# Patient Record
Sex: Female | Born: 1980 | Race: White | Hispanic: No | Marital: Married | Smoking: Never smoker
Health system: Southern US, Community
[De-identification: ages and names within clinical notes are randomized; demographics above are authoritative.]

## PROBLEM LIST (undated history)

## (undated) DIAGNOSIS — T7840XA Allergy, unspecified, initial encounter: Secondary | ICD-10-CM

## (undated) DIAGNOSIS — J302 Other seasonal allergic rhinitis: Secondary | ICD-10-CM

## (undated) DIAGNOSIS — E282 Polycystic ovarian syndrome: Secondary | ICD-10-CM

## (undated) DIAGNOSIS — E119 Type 2 diabetes mellitus without complications: Secondary | ICD-10-CM

## (undated) HISTORY — DX: Type 2 diabetes mellitus without complications: E11.9

## (undated) HISTORY — DX: Other seasonal allergic rhinitis: J30.2

## (undated) HISTORY — DX: Polycystic ovarian syndrome: E28.2

## (undated) HISTORY — DX: Allergy, unspecified, initial encounter: T78.40XA

---

## 2010-11-03 ENCOUNTER — Emergency Department (HOSPITAL_COMMUNITY)
Admission: EM | Admit: 2010-11-03 | Discharge: 2010-11-04 | Payer: Self-pay | Source: Home / Self Care | Admitting: Emergency Medicine

## 2010-11-06 LAB — COMPREHENSIVE METABOLIC PANEL
ALT: 13 U/L (ref 0–35)
AST: 22 U/L (ref 0–37)
Albumin: 4.3 g/dL (ref 3.5–5.2)
Alkaline Phosphatase: 68 U/L (ref 39–117)
BUN: 10 mg/dL (ref 6–23)
CO2: 23 mEq/L (ref 19–32)
Calcium: 8.9 mg/dL (ref 8.4–10.5)
Chloride: 100 mEq/L (ref 96–112)
Creatinine, Ser: 0.68 mg/dL (ref 0.4–1.2)
GFR calc Af Amer: >60 mL/min (ref >60)
GFR calc non Af Amer: >60 mL/min (ref >60)
Glucose, Bld: 150 mg/dL — ABNORMAL HIGH (ref 70–99)
Potassium: 2.8 mEq/L — ABNORMAL LOW (ref 3.5–5.1)
Sodium: 133 mEq/L — ABNORMAL LOW (ref 135–145)
Total Bilirubin: 0.4 mg/dL (ref 0.3–1.2)
Total Protein: 7.2 g/dL (ref 6.0–8.3)

## 2010-11-06 LAB — URINALYSIS, ROUTINE W REFLEX MICROSCOPIC
Bilirubin Urine: NEGATIVE
Ketones, ur: 40 mg/dL — AB
Leukocytes, UA: NEGATIVE
Nitrite: NEGATIVE
Protein, ur: NEGATIVE mg/dL
Specific Gravity, Urine: 1.045 — ABNORMAL HIGH (ref 1.005–1.030)
Urine Glucose, Fasting: >1000 mg/dL — AB
Urobilinogen, UA: 0.2 mg/dL (ref 0.0–1.0)
pH: 6 (ref 5.0–8.0)

## 2010-11-06 LAB — DIFFERENTIAL
Basophils Absolute: 0 10*3/uL (ref 0.0–0.1)
Basophils Relative: 0 % (ref 0–1)
Eosinophils Absolute: 0.1 10*3/uL (ref 0.0–0.7)
Eosinophils Relative: 0 % (ref 0–5)
Lymphocytes Relative: 8 % — ABNORMAL LOW (ref 12–46)
Lymphs Abs: 1.2 10*3/uL (ref 0.7–4.0)
Monocytes Absolute: 0.5 10*3/uL (ref 0.1–1.0)
Monocytes Relative: 3 % (ref 3–12)
Neutro Abs: 13.6 10*3/uL — ABNORMAL HIGH (ref 1.7–7.7)
Neutrophils Relative %: 89 % — ABNORMAL HIGH (ref 43–77)

## 2010-11-06 LAB — CBC
HCT: 40.3 % (ref 36.0–46.0)
Hemoglobin: 13.5 g/dL (ref 12.0–15.0)
MCH: 28.9 pg (ref 26.0–34.0)
MCHC: 33.5 g/dL (ref 30.0–36.0)
MCV: 86.3 fL (ref 78.0–100.0)
Platelets: 281 10*3/uL (ref 150–400)
RBC: 4.67 MIL/uL (ref 3.87–5.11)
RDW: 13 % (ref 11.5–15.5)
WBC: 15.3 10*3/uL — ABNORMAL HIGH (ref 4.0–10.5)

## 2010-11-06 LAB — POCT PREGNANCY, URINE: Preg Test, Ur: NEGATIVE

## 2010-11-06 LAB — URINE MICROSCOPIC-ADD ON

## 2010-11-06 LAB — GLUCOSE, CAPILLARY: Glucose-Capillary: 153 mg/dL — ABNORMAL HIGH (ref 70–99)

## 2010-11-06 LAB — WET PREP, GENITAL
Clue Cells Wet Prep HPF POC: NONE SEEN
Trich, Wet Prep: NONE SEEN
WBC, Wet Prep HPF POC: NONE SEEN
Yeast Wet Prep HPF POC: NONE SEEN

## 2010-11-06 LAB — LIPASE, BLOOD: Lipase: 25 U/L (ref 11–59)

## 2010-11-08 LAB — GC/CHLAMYDIA PROBE AMP, GENITAL
Chlamydia, DNA Probe: NEGATIVE
GC Probe Amp, Genital: NEGATIVE

## 2010-11-10 ENCOUNTER — Ambulatory Visit (HOSPITAL_COMMUNITY)
Admission: RE | Admit: 2010-11-10 | Discharge: 2010-11-10 | Payer: Self-pay | Source: Home / Self Care | Attending: Obstetrics & Gynecology | Admitting: Obstetrics & Gynecology

## 2010-11-10 ENCOUNTER — Encounter (INDEPENDENT_AMBULATORY_CARE_PROVIDER_SITE_OTHER): Payer: Self-pay | Admitting: Obstetrics & Gynecology

## 2010-11-13 LAB — GLUCOSE, CAPILLARY
Glucose-Capillary: 211 mg/dL — ABNORMAL HIGH (ref 70–99)
Glucose-Capillary: 183 mg/dL — ABNORMAL HIGH (ref 70–99)
Glucose-Capillary: 189 mg/dL — ABNORMAL HIGH (ref 70–99)

## 2010-11-13 LAB — DIFFERENTIAL
Basophils Absolute: 0 10*3/uL (ref 0.0–0.1)
Basophils Relative: 1 % (ref 0–1)
Eosinophils Absolute: 0.1 10*3/uL (ref 0.0–0.7)
Eosinophils Relative: 2 % (ref 0–5)
Lymphocytes Relative: 26 % (ref 12–46)
Lymphs Abs: 1.1 10*3/uL (ref 0.7–4.0)
Monocytes Absolute: 0.3 10*3/uL (ref 0.1–1.0)
Monocytes Relative: 8 % (ref 3–12)
Neutro Abs: 2.7 10*3/uL (ref 1.7–7.7)
Neutrophils Relative %: 64 % (ref 43–77)

## 2010-11-13 LAB — COMPREHENSIVE METABOLIC PANEL
ALT: 10 U/L (ref 0–35)
AST: 17 U/L (ref 0–37)
Albumin: 4 g/dL (ref 3.5–5.2)
Alkaline Phosphatase: 63 U/L (ref 39–117)
BUN: 9 mg/dL (ref 6–23)
CO2: 30 mEq/L (ref 19–32)
Calcium: 9.1 mg/dL (ref 8.4–10.5)
Chloride: 103 mEq/L (ref 96–112)
Creatinine, Ser: 0.62 mg/dL (ref 0.4–1.2)
GFR calc Af Amer: >60 mL/min (ref >60)
GFR calc non Af Amer: >60 mL/min (ref >60)
Glucose, Bld: 231 mg/dL — ABNORMAL HIGH (ref 70–99)
Potassium: 3.9 mEq/L (ref 3.5–5.1)
Sodium: 139 mEq/L (ref 135–145)
Total Bilirubin: 0.5 mg/dL (ref 0.3–1.2)
Total Protein: 7.1 g/dL (ref 6.0–8.3)

## 2010-11-13 LAB — SURGICAL PCR SCREEN
MRSA, PCR: NEGATIVE
Staphylococcus aureus: POSITIVE — AB

## 2010-11-13 LAB — CBC
HCT: 42.6 % (ref 36.0–46.0)
Hemoglobin: 14.3 g/dL (ref 12.0–15.0)
MCH: 29.3 pg (ref 26.0–34.0)
MCHC: 33.6 g/dL (ref 30.0–36.0)
MCV: 87.3 fL (ref 78.0–100.0)
Platelets: 284 10*3/uL (ref 150–400)
RBC: 4.88 MIL/uL (ref 3.87–5.11)
RDW: 13.2 % (ref 11.5–15.5)
WBC: 4.3 10*3/uL (ref 4.0–10.5)

## 2010-11-13 LAB — PREGNANCY, URINE: Preg Test, Ur: NEGATIVE

## 2010-11-14 LAB — GLUCOSE, CAPILLARY
Glucose-Capillary: 149 mg/dL — ABNORMAL HIGH (ref 70–99)
Glucose-Capillary: 153 mg/dL — ABNORMAL HIGH (ref 70–99)
Glucose-Capillary: 183 mg/dL — ABNORMAL HIGH (ref 70–99)

## 2010-11-20 NOTE — Op Note (Signed)
Christine Compton, ROMANEK                ACCOUNT NO.:  [id number]  MEDICAL RECORD NO.:  [id number]          PATIENT TYPE:  OIB  LOCATION:  9307                          FACILITY:  [facility]  PHYSICIAN:  Darryl Nestle, MD     DATE OF BIRTH:  Nov 11, 1980  DATE OF PROCEDURE: DATE OF DISCHARGE:  11/10/2010                              OPERATIVE REPORT   PREOPERATIVE DIAGNOSIS:  Bilateral large ovarian cyst.  POSTOPERATIVE DIAGNOSIS:  Bilateral large ovarian cyst.  PROCEDURE:  Da Vinci robot assisted laparoscopic bilateral ovarian cystectomy.  SURGEON:  Darryl Nestle, MD.  ASSISTANTCordelia Pen A. Rosalio Macadamia, M.D.  ANESTHESIA:  General.  FINDINGS:  Right ovary enlarged about 10 cm with 2 large cysts, left ovary enlarged to about 6 to 7 cm with one cyst.  No surface nodules, no abnormal vascularity.  Tubes normal, appendix normal.  No evidence of endometriosis or adhesions and upper abdomen grossly normal.  SPECIMEN:  Ovarian cyst wall was from both the ovaries sent to Pathology.  ESTIMATED BLOOD LOSS:  Minimal.  IV FLUIDS:  LR 2000 mL.  URINE OUTPUT:  800 mL clear.  COMPLICATIONS:  None.  CONDITION:  Stable.  The patient brought to PACU.  PROCEDURE:  The patient is a 30 year old with type 1 diabetes, polycystic ovarian syndrome who presented with abdominal pain and was noted on CT scan to have bilateral ovarian cyst, the right ovary being about 12 cm in size with 2 large cysts and the left ovary being 7 cm in size with 1 large cyst.  The patient was given options of laparotomy versus laparoscopic removal and chose to proceed with laparoscopic route.  Risks and complications of surgery including infection, bleeding, damage to internal organs, recurrence of cyst as well as possible laparotomy and long-term complications were reviewed.  The patient was understanding.  Informed written consent was obtained.  The patient was brought to the operating room with IV running.  She underwent  general endotracheal anesthesia without difficulty.  She was then given dorsal lithotomy position. Parts were prepped, Foley catheter was placed, and then she was draped in normal sterile fashion. Examination under anesthesia revealed right-sided pelvic mass, soft, cystic; however, no left-sided mass was palpable.  The cervix was exposed with 2 Deaver retractors and anterior lip of cervix was grasped with tenaculum.  Uterus sounded to 7 cm.  A Hulka manipulator was introduced and was attached to the anterior cervical lip.  The 2-D retractors were removed.  A half size lap sponge was placed underneath the vaginal manipulator between the manipulator and the vaginal posterior fourchette.  Gloves were changed, attention was now focused to the abdomen.  A vertical midline incision was made 2 cm above the umbilicus due to the large size of the mass and incision was carried down to the underlying fascia.  Fascia was grasped in the midline and extended superiorly, incised, and extended superiorly and inferiorly. Posterior rectus sheath was thick which was grasped with 2 mosquito clamps and incised in between and with further dissection, peritoneal entry was made.  A pursestring stitch was taken around the fascia with 0 Vicryl, then  Hasson cannula was introduced and pneumo insufflation was begun, which was noted to be adequate.  Two incisions were made on both the sides in the lower part of the abdomen for 3 robotic ports and 1 assistant port after injecting 0.25% Marcaine and with direct visualization with the laparoscope, the trocar cannulas were introduced in standard fashion.  The patient was given Trendelenburg position and examination of the pelvis revealed a large right ovarian cyst, a relatively smaller left ovary with the cyst within and other findings as discussed above.  The robot was now brought to the patient's left side and was docked in a standard fashion from the side.  In the first  arm, an endoscissors with energy was introduced, in the second arm PK with bipolar energy was introduced, and the third a Prograsp was introduced. All the instruments were now secured and Dr. Juliene Pina scrubbed out at this point to go to the robotic console.  A thorough peritoneal irrigation was performed for potential malignant cells and the irrigation fluid was sent to Pathology.  Uterus was made anteverted and both the ovaries were inspected.  Tubes appeared normal, right ovary was enlarged, left ovary was slightly enlarged compared to the right, and bilateral ureters were seen very well.  Appendix appeared to be normal and upper abdomen survey was normal.  With Endoscissors, a linear incision was made on the right ovarian cyst away from the hilum and incision was extended.  At this point, the cyst wall ruptured as it was very thin and the patient expelled significant amount of fluid.  Irrigation was performed.  The cut edges of the cyst along with the ovarian capsule were grasped and the cyst wall could be separated easily from the ovarian capsule.  This was done in stepwise fashion.  Also at the same time, we obtained hemostasis.  The cyst wall was entirely dissected off and was removed and sent to Pathology.  There was another cyst noted underneath the one we removed, which was removed again through the same opening by creating a linear incision and again dissecting the cyst wall ovary from the ovarian capsule.  Hemostasis was obtained with cauterization. Irrigation was performed of the ovarian bed and hemostasis appeared to be adequate.  Now the attention was focused on the patient's left ovary, which has thicker capsule that the right and was difficult to identify the cyst but on further dissection into the capsule, a cyst in the ovary was easily seen, which was about 5 cm in size.  This too was dissected away from the ovarian capsule and cyst wall was removed and sent to Pathology.   Irrigation was performed thoroughly of the ovarian cyst bed and hemostasis was obtained.  At this point, bilateral ovarian cystectomy was complete.  Decision was made to de-dock the robot.  All instruments were removed.  At this point, the ovarian cystectomy was complete and two pieces of Interceed were placed in the abdominal cavity and each was used to wrap the raw ovarian surface after copious suction and irrigation.  At this point, surgery was deemed complete.  All instruments were removed.  Robot was de-docked and moved away from the patient.  The laparoscope was reintroduced in the abdomen.  The patient was removed from Trendelenburg position to bring all the suction irrigation fluid into the pelvic area and with the help of suction irrigator, the extra fluid was aspirated with careful watch on the intra- abdominal organs.  At this point, all the instruments were  removed.  The robotic cannulas as well as assistant port cannula was removed under visualization.  Pneumoperitoneum was deflated and the laparoscope and Hasson cannula were removed under visualization.  The stay suture on the fascia was tightly closed and closure was adequate.  The skin incision was closed using 2-0 Monocryl in subcuticular fashion and Dermabond was applied on the incisions.  The vaginal instruments, the pack, and Foley catheter were removed.  All counts were correct x2.  Plan is to discharge the patient home if stable from the recovery room. This was discussed with the family and they agree.  The patient was stable with well controlled diabetes to the surgery.  Postoperatively, all the surgical findings were reviewed with the patient's family, they voiced understanding, pathology pending.  Dr. Rosalio Macadamia was my assistant.     Darryl Nestle, MD     VM/MEDQ  D:  11/10/2010  T:  11/11/2010  Job:  [id number]  Electronically Signed by Susa Day Ailine Hefferan  on 11/20/2010 06:29:21 PM

## 2011-10-23 HISTORY — PX: OVARIAN CYST REMOVAL: SHX89

## 2013-11-11 ENCOUNTER — Encounter: Payer: 59 | Attending: Internal Medicine | Admitting: *Deleted

## 2013-11-11 ENCOUNTER — Encounter (INDEPENDENT_AMBULATORY_CARE_PROVIDER_SITE_OTHER): Payer: Self-pay

## 2013-11-11 ENCOUNTER — Encounter: Payer: Self-pay | Admitting: *Deleted

## 2013-11-11 VITALS — Ht 66.0 in | Wt 156.0 lb

## 2013-11-11 DIAGNOSIS — Z713 Dietary counseling and surveillance: Secondary | ICD-10-CM | POA: Insufficient documentation

## 2013-11-11 DIAGNOSIS — E109 Type 1 diabetes mellitus without complications: Secondary | ICD-10-CM | POA: Insufficient documentation

## 2013-11-11 DIAGNOSIS — E1065 Type 1 diabetes mellitus with hyperglycemia: Secondary | ICD-10-CM

## 2013-11-11 DIAGNOSIS — E282 Polycystic ovarian syndrome: Secondary | ICD-10-CM | POA: Insufficient documentation

## 2013-11-11 DIAGNOSIS — IMO0002 Reserved for concepts with insufficient information to code with codable children: Secondary | ICD-10-CM

## 2013-11-11 NOTE — Progress Notes (Signed)
Appt start time: 1615 end time:  1715.  Assessment:  Patient was seen on  11/11/13 for individual diabetes education. She states she has had DM 1 since age 33, she is currently preparing to start on the [place] and is here for Carb Counting to prepare for her pump start. She is here with her mother and her partner, Larena Glassman who she lives with. They state they shop for food together and Larena Glassman does the cooking. She works at [employer] from 8:30 - 5:30 Monday through Friday. She states she SMBG about 20  times a day, ranging from 60 to 300 mg/dl. She enjoys her dogs and going to the beach.  Current HbA1c: 7.8% last year  Preferred Learning Style:   No preference indicated   Learning Readiness:   Ready  Change in progress  MEDICATIONS: see list. Diabetes medications include Lantus, Humalog and Metformin for her PCOS  DIETARY INTAKE:  24-hr recall:  B ( AM): PopTart, iced, Decaf coffee with 1/2 and 1/2 and sweet and low  Snk ( AM): no  L ( PM): brings from home: left overs OR chicken noodle soup, etc, water Snk ( PM): no D ( PM): lean meat or chicken salad, starch or beans, vegetables, string cheese, water Snk ( PM): only if BG is too low, then popcorn Beverages: decaf or water  Usual physical activity: not at this time  Estimated energy needs: 1600 calories 180 g carbohydrates 120 g protein 44 g fat   Intervention:  Nutrition counseling provided.  Provided education on macronutrients on glucose levels.  Provided education on carb counting, importance of regularly scheduled meals/snacks, and meal planning  Provided overview of transition from injections to insulin pump including use of only analog insulin in the pump, definition of basal and bolus and how bolus decisions are determined by the pump  Provided brief overview of CGM, the need for calibrations and the value of having access to glucose data and alarms between finger sticks.  Teaching Method Utilized:  Visual, Auditory and Hands on  Handouts given during visit include: Carb Counting and Food Label handouts Meal Plan Card Intro to Pumping handout  Barriers to learning/adherence to lifestyle change: none noted  Diabetes self-care support plan:   [facility] DM 1 support group info provided, she is not interested in participating at this time  Demonstrated degree of understanding via:  Teach Back   Monitoring/Evaluation:  Dietary intake, exercise, reading food labels, and body weight prn.

## 2013-11-16 ENCOUNTER — Encounter: Payer: Self-pay | Admitting: *Deleted

## 2015-10-25 DIAGNOSIS — Z794 Long term (current) use of insulin: Secondary | ICD-10-CM | POA: Insufficient documentation

## 2015-11-21 ENCOUNTER — Telehealth: Payer: Self-pay

## 2015-11-21 NOTE — Telephone Encounter (Signed)
Opened in error

## 2015-12-09 ENCOUNTER — Encounter: Payer: Self-pay | Admitting: Cardiovascular Disease

## 2015-12-09 ENCOUNTER — Encounter: Payer: Self-pay | Admitting: Gastroenterology

## 2015-12-09 ENCOUNTER — Ambulatory Visit (INDEPENDENT_AMBULATORY_CARE_PROVIDER_SITE_OTHER): Payer: 59 | Admitting: Cardiovascular Disease

## 2015-12-09 VITALS — BP 110/77 | HR 84 | Ht 66.0 in | Wt 162.0 lb

## 2015-12-09 DIAGNOSIS — R0789 Other chest pain: Secondary | ICD-10-CM

## 2015-12-09 DIAGNOSIS — K219 Gastro-esophageal reflux disease without esophagitis: Secondary | ICD-10-CM

## 2015-12-09 DIAGNOSIS — R9431 Abnormal electrocardiogram [ECG] [EKG]: Secondary | ICD-10-CM

## 2015-12-09 NOTE — Patient Instructions (Signed)
Your physician recommends that you schedule a follow-up appointment as needed with Dr. Bronson Ing  Your physician recommends that you continue on your current medications as directed. Please refer to the Current Medication list given to you today.  You have been referred to DR. JACO[facility]  Thank you for choosing [facility]!!

## 2015-12-09 NOTE — Progress Notes (Signed)
Patient ID: Kriste Basque, female   DOB: 1981-07-21, 35 y.o.   MRN: [id number]       CARDIOLOGY CONSULT NOTE  Patient ID: LEILYNN CID MRN: [id number] DOB/AGE: 1981-05-03 35 y.o.  Admit date: (Not on file) Primary Physician Jana Half  Reason for Consultation: abnormal ECG, chest fullness  HPI: 35 yr old woman with diabetes who presents for evaluation of abnormal ECG.  ECG in January showed normal sinus rhythm without ischemic or arrhythmic abnormalities, PR 114 ms.  She very seldom experiences an extra beat/palpitation. Denies exertional chest discomfort and dyspnea.  Has been treated for GERD, initially with Prilosec 20 mg bid x 2 weeks. This resolved symptoms but as soon as she stopped it, symptoms recurred. Now on Protonix 20 mg daily.  Has a sensation of chest fullness at around 3 pm. Also has epigastric discomfort. No diarrhea/constipation. No burning sensation in throat.     No Known Allergies  Current Outpatient Prescriptions  Medication Sig Dispense Refill  . cetirizine (ZYRTEC) 10 MG tablet Take 10 mg by mouth daily.    . Cholecalciferol (VITAMIN D) 2000 units CAPS Take 1 capsule by mouth daily.    . Insulin Human (INSULIN PUMP) SOLN Inject into the skin as directed. Humalog insulin pump    . pantoprazole (PROTONIX) 20 MG tablet Take 20 mg by mouth daily.    . vitamin B-12 (CYANOCOBALAMIN) 50 MCG tablet Take 50 mcg by mouth daily.     No current facility-administered medications for this visit.    Past Medical History  Diagnosis Date  . Diabetes mellitus without complication ([place])   . PCOS (polycystic ovarian syndrome)   . Seasonal allergies     Past Surgical History  Procedure Laterality Date  . Ovarian cyst removal  2013    Social History   Social History  . Marital Status: Married    Spouse Name: N/A  . Number of Children: N/A  . Years of Education: N/A   Occupational History  . Not on file.   Social History Main Topics  .  Smoking status: Never Smoker   . Smokeless tobacco: Never Used  . Alcohol Use: Not on file  . Drug Use: Not on file  . Sexual Activity: Not on file   Other Topics Concern  . Not on file   Social History Narrative     No family history of premature CAD in 1st degree relatives.  Prior to Admission medications   Medication Sig Start Date End Date Taking? Authorizing Provider  cetirizine (ZYRTEC) 10 MG tablet Take 10 mg by mouth daily.   Yes Historical Provider, MD  Cholecalciferol (VITAMIN D) 2000 units CAPS Take 1 capsule by mouth daily.   Yes Historical Provider, MD  Insulin Human (INSULIN PUMP) SOLN Inject into the skin as directed. Humalog insulin pump   Yes Historical Provider, MD  pantoprazole (PROTONIX) 20 MG tablet Take 20 mg by mouth daily.   Yes Historical Provider, MD  vitamin B-12 (CYANOCOBALAMIN) 50 MCG tablet Take 50 mcg by mouth daily.   Yes Historical Provider, MD     Review of systems complete and found to be negative unless listed above in HPI     Physical exam Blood pressure 110/77, pulse 84, height 5\' 6"  (1.676 m), weight 162 lb (73.483 kg). General: NAD Neck: No JVD, no thyromegaly or thyroid nodule.  Lungs: Clear to auscultation bilaterally with normal respiratory effort. CV: Nondisplaced PMI. Regular rate and rhythm, normal S1/S2, no S3/S4, soft 1/6  systolic murmur along left sternal border (benign).  No peripheral edema.  No carotid bruit.  Normal pedal pulses.  Abdomen: Soft, nontender, no distention.  Skin: Intact without lesions or rashes.  Neurologic: Alert and oriented x 3.  Psych: Normal affect. Extremities: No clubbing or cyanosis.  HEENT: Normal.   ECG: Most recent ECG reviewed.  Labs:   Lab Results  Component Value Date   WBC 4.3 11/09/2010   HGB 14.3 11/09/2010   HCT 42.6 11/09/2010   MCV 87.3 11/09/2010   PLT 284 11/09/2010   No results for input(s): NA, K, CL, CO2, BUN, CREATININE, CALCIUM, PROT, BILITOT, ALKPHOS, ALT, AST,  GLUCOSE in the last 168 hours.  Invalid input(s): LABALBU No results found for: CKTOTAL, CKMB, CKMBINDEX, TROPONINI No results found for: CHOL No results found for: HDL No results found for: LDLCALC No results found for: TRIG No results found for: CHOLHDL No results found for: LDLDIRECT       Studies: No results found.  ASSESSMENT AND PLAN:  1. Chest fullness: Noncardiac and likely related to GERD. Will make GI referral per patient's request. Continue Protonix.  2. "Abnormal ECG": Benign finding and asymptomatic. No further investigations are warranted.  Dispo: f/u prn.   Signed: Kate Sable, M.D., F.A.C.C.  12/09/2015, 8:38 AM

## 2016-01-20 ENCOUNTER — Ambulatory Visit (INDEPENDENT_AMBULATORY_CARE_PROVIDER_SITE_OTHER): Payer: 59 | Admitting: Neurology

## 2016-01-20 ENCOUNTER — Encounter: Payer: Self-pay | Admitting: Neurology

## 2016-01-20 ENCOUNTER — Telehealth: Payer: Self-pay | Admitting: Neurology

## 2016-01-20 VITALS — BP 110/80 | HR 93 | Ht 66.0 in | Wt 162.0 lb

## 2016-01-20 DIAGNOSIS — R202 Paresthesia of skin: Secondary | ICD-10-CM | POA: Diagnosis not present

## 2016-01-20 DIAGNOSIS — E538 Deficiency of other specified B group vitamins: Secondary | ICD-10-CM | POA: Diagnosis not present

## 2016-01-20 DIAGNOSIS — E109 Type 1 diabetes mellitus without complications: Secondary | ICD-10-CM | POA: Insufficient documentation

## 2016-01-20 NOTE — Progress Notes (Signed)
Note routed

## 2016-01-20 NOTE — Telephone Encounter (Signed)
Pt was asked to up her dosage of vitamin  B but has a flex account at work and would like to use that and would need a letter stating  Why she needs to take it please call her at [phone]

## 2016-01-20 NOTE — Telephone Encounter (Signed)
VM-PT left message in regards to her Vitamin B 12/Dawn [phone]

## 2016-01-20 NOTE — Progress Notes (Signed)
[facility] Clinic Note - Initial Visit   Date: 01/20/2016  Christine Compton MRN: [id number] DOB: 1981-04-14   Dear Dr. Buddy Duty:  Thank you for your kind referral of Christine Compton for consultation of paresthesias of the hands and feet. Although her history is well known to you, please allow [place] to reiterate it for the purpose of our medical record. The patient was accompanied to the clinic by self.    History of Present Illness: Christine Compton is a 35 y.o. right-handed Caucasian female with PCOS, insulin-dependent type 1 diabetes, and seasonal allergies presenting for evaluation of paresthesias of the hands and feet.    Her mother was hospitalized in December 2016 and within a few days of this, she began experiencing numbness of the face, jaw, eye, arms, hands, and sometimes in the hands and feet. The location always varies and duration can vary from 5 minutes to 20-minutes.  Initially, it was thought to be due to anxiety however her vitamin B12 was low-normal and she started vitamin 47mcg which resolved symptoms.  She is not vegetarian or vegan.  She was doing well until early February, when symptoms started returned.  Now, she mostly experiences numbness/tingling involving the right elbow and hand and finger tips.  She has rare spells involving the left arm, legs, or face.  Symptoms are sporadic lasting for 2-3 days but then not occuring for several weeks.  She had identified triggers, exacerbating, or alleviating factors.  She does not have any anxiety, especially now that her mother is doing well.    She denies any weakness, gait instability, or falls.    Out-side paper records, electronic medical record, and images have been reviewed where available and summarized as:  Labs 10/25/2015:  HbA1c 7.9*, vitamin B2 208, TSH 0.72   Past Medical History  Diagnosis Date  . Diabetes mellitus without complication ([place])   . PCOS (polycystic ovarian syndrome)   . Seasonal  allergies     Past Surgical History  Procedure Laterality Date  . Ovarian cyst removal  2013     Medications:  Outpatient Encounter Prescriptions as of 01/20/2016  Medication Sig Note  . Cetirizine HCl (ZYRTEC ALLERGY) 10 MG CAPS  01/20/2016: Received from: [facility]  . Cholecalciferol (VITAMIN D) 2000 units CAPS Take 1 capsule by mouth daily.   Marland Kitchen GLUCAGON EMERGENCY 1 MG injection  01/20/2016: Received from: External Pharmacy  . Insulin Human (INSULIN PUMP) SOLN Inject into the skin as directed. Humalog insulin pump   . insulin lispro (HUMALOG) 100 UNIT/ML injection  01/20/2016: Received from: [facility]  . pantoprazole (PROTONIX) 20 MG tablet Take 20 mg by mouth daily.   . vitamin B-12 (CYANOCOBALAMIN) 50 MCG tablet Take 50 mcg by mouth daily.   . [DISCONTINUED] cetirizine (ZYRTEC) 10 MG tablet Take 10 mg by mouth daily.   . [DISCONTINUED] omeprazole (PRILOSEC) 20 MG capsule  01/20/2016: Received from: External Pharmacy   No facility-administered encounter medications on file as of 01/20/2016.     Allergies: No Known Allergies  Family History: Family History  Problem Relation Age of Onset  . Heart disease Paternal Grandfather 23  . COPD Maternal Grandmother   . Cancer Maternal Grandfather     prostate cancer & died at age 36  . Ovarian cancer Paternal Grandmother     Social History: Social History  Substance Use Topics  . Smoking status: Never Smoker   . Smokeless tobacco: Never Used  .  Alcohol Use: No   Social History   Social History Narrative   Lives with wife in 2 story home.  Has 1 stepdaughter.  Works at [employer].  Education: associates degree.    Review of Systems:  CONSTITUTIONAL: No fevers, chills, night sweats, or weight loss.   EYES: No visual changes or eye pain ENT: No hearing changes.  No history of nose bleeds.   RESPIRATORY: No cough, wheezing and shortness of breath.   CARDIOVASCULAR:  Negative for chest pain, and palpitations.   GI: Negative for abdominal discomfort, blood in stools or black stools.  No recent change in bowel habits.   GU:  No history of incontinence.   MUSCLOSKELETAL: No history of joint pain or swelling.  No myalgias.   SKIN: Negative for lesions, rash, and itching.   HEMATOLOGY/ONCOLOGY: Negative for prolonged bleeding, bruising easily, and swollen nodes.  No history of cancer.   ENDOCRINE: Negative for cold or heat intolerance, polydipsia or goiter.   PSYCH:  No depression or anxiety symptoms.   NEURO: As Above.   Vital Signs:  BP 110/80 mmHg  Pulse 93  Ht 5\' 6"  (1.676 m)  Wt 162 lb (73.483 kg)  BMI 26.16 kg/m2  SpO2 97% Pain Scale: 0 on a scale of 0-10   General Medical Exam:   General:  Well appearing, comfortable.   Eyes/ENT: see cranial nerve examination.   Neck: No masses appreciated.  Full range of motion without tenderness.  No carotid bruits. Respiratory:  Clear to auscultation, good air entry bilaterally.   Cardiac:  Regular rate and rhythm, no murmur.   Extremities:  No deformities, edema, or skin discoloration.  Skin:  No rashes or lesions.  Neurological Exam: MENTAL STATUS including orientation to time, place, person, recent and remote memory, attention span and concentration, language, and fund of knowledge is normal.  Speech is not dysarthric.  CRANIAL NERVES: II:  No visual field defects.  Unremarkable fundi.   III-IV-VI: Pupils equal round and reactive to light.  Normal conjugate, extra-ocular eye movements in all directions of gaze.  No nystagmus.  Right ptosis (old).   V:  Normal facial sensation.     VII:  Normal facial symmetry and movements.  No pathologic facial reflexes.  VIII:  Normal hearing and vestibular function.   IX-X:  Normal palatal movement.   XI:  Normal shoulder shrug and head rotation.   XII:  Normal tongue strength and range of motion, no deviation or fasciculation.  MOTOR:  No atrophy,  fasciculations or abnormal movements.  No pronator drift.  Tone is normal.    Right Upper Extremity:    Left Upper Extremity:    Deltoid  5/5   Deltoid  5/5   Biceps  5/5   Biceps  5/5   Triceps  5/5   Triceps  5/5   Wrist extensors  5/5   Wrist extensors  5/5   Wrist flexors  5/5   Wrist flexors  5/5   Finger extensors  5/5   Finger extensors  5/5   Finger flexors  5/5   Finger flexors  5/5   Dorsal interossei  5/5   Dorsal interossei  5/5   Abductor pollicis  5/5   Abductor pollicis  5/5   Tone (Ashworth scale)  0  Tone (Ashworth scale)  0   Right Lower Extremity:    Left Lower Extremity:    Hip flexors  5/5   Hip flexors  5/5   Hip  extensors  5/5   Hip extensors  5/5   Knee flexors  5/5   Knee flexors  5/5   Knee extensors  5/5   Knee extensors  5/5   Dorsiflexors  5/5   Dorsiflexors  5/5   Plantarflexors  5/5   Plantarflexors  5/5   Toe extensors  5/5   Toe extensors  5/5   Toe flexors  5/5   Toe flexors  5/5   Tone (Ashworth scale)  0  Tone (Ashworth scale)  0   MSRs:  Right                                                                 Left brachioradialis 2+  brachioradialis 2+  biceps 2+  biceps 2+  triceps 2+  triceps 2+  patellar 2+  patellar 2+  ankle jerk 2+  ankle jerk 2+  Hoffman no  Hoffman no  plantar response down  plantar response down   SENSORY:  Normal and symmetric perception of light touch, pinprick, vibration, and proprioception.  Romberg's sign absent.   COORDINATION/GAIT: Normal finger-to- nose-finger and heel-to-shin.  Intact rapid alternating movements bilaterally.  Able to rise from a chair without using arms.  Gait narrow based and stable. Tandem and stressed gait intact.    IMPRESSION: Mrs. Sakal is a 35 year-old female referred for evaluation of generalized paresthesias.  Her neurological exam shows right ptosis which patient reports is old and otherwise is entirely normal and non-focal.  I reassured her that she does not have any evidence of  diabetic neuropathy.  Her labs indicated low vitamin B12 which is the likely etiology.  I will increase the dose of her vitamin B12 to 1044mcg daily.  If not improvement going forward, she may need vitamin B12 injections.    Return to clinic as needed   The duration of this appointment visit was 35 minutes of face-to-face time with the patient.  Greater than 50% of this time was spent in counseling, explanation of diagnosis, planning of further management, and coordination of care.   Thank you for allowing me to participate in patient's care.  If I can answer any additional questions, I would be pleased to do so.    Sincerely,    Calvary Difranco K. Posey Pronto, DO

## 2016-01-20 NOTE — Patient Instructions (Signed)
Start vitamin B12 1048mcg daily Keep up the great work with your diabetes.  There are no signs of neuropathy on your exam!   Return to clinic as needed

## 2016-01-25 ENCOUNTER — Encounter: Payer: Self-pay | Admitting: *Deleted

## 2016-01-25 NOTE — Telephone Encounter (Signed)
Letter faxed to patient.  [phone]

## 2016-02-08 ENCOUNTER — Ambulatory Visit (INDEPENDENT_AMBULATORY_CARE_PROVIDER_SITE_OTHER): Payer: 59 | Admitting: Gastroenterology

## 2016-02-08 ENCOUNTER — Encounter: Payer: Self-pay | Admitting: Gastroenterology

## 2016-02-08 VITALS — BP 106/70 | HR 68 | Ht 66.0 in | Wt 163.0 lb

## 2016-02-08 DIAGNOSIS — K219 Gastro-esophageal reflux disease without esophagitis: Secondary | ICD-10-CM

## 2016-02-08 MED ORDER — PANTOPRAZOLE SODIUM 40 MG PO TBEC: 40.0000 mg | DELAYED_RELEASE_TABLET | Freq: Every day | ORAL | Status: DC

## 2016-02-08 NOTE — Progress Notes (Signed)
HPI: This is a  very pleasant 35 year old woman   who was referred to me by Jake Samples, PA*  to evaluate  dyspepsia, globus .    Chief complaint is dyspepsia, globus  Thinks she had GERD.  Has no pyrosis but has fullness at top of stomach.  Belching helps.  This can occur daily.  She had gained 10-20 pounds in 2-3 years.  Eating healthier generally helps  Feels golf ball in throat, no trouble swallowing.  T this globus type sensation lasts several minutes, often associated with the fullness in her upper abdomen.  She has tried OTC omeprazole this helped a bit, then changed to protonix 20mg  Prescription.  She takes the protonix about an hour before breakfast.  Pretty rare NSAIDs, usually with her period.  Diabetic since 15, eats 3 meals per day.  Feeling fuller than usual recently.  No overt bleeding, no dysphagia, no weight loss. Review of systems: Pertinent positive and negative review of systems were noted in the above HPI section. Complete review of systems was performed and was otherwise normal.   Past Medical History  Diagnosis Date  . Diabetes mellitus without complication ([place])   . PCOS (polycystic ovarian syndrome)   . Seasonal allergies     Past Surgical History  Procedure Laterality Date  . Ovarian cyst removal  2013    Current Outpatient Prescriptions  Medication Sig Dispense Refill  . Cetirizine HCl (ZYRTEC ALLERGY) 10 MG CAPS     . Cholecalciferol (VITAMIN D) 2000 units CAPS Take 1 capsule by mouth daily.    Marland Kitchen GLUCAGON EMERGENCY 1 MG injection     . Insulin Human (INSULIN PUMP) SOLN Inject into the skin as directed. Humalog insulin pump    . insulin lispro (HUMALOG) 100 UNIT/ML injection     . pantoprazole (PROTONIX) 20 MG tablet Take 20 mg by mouth daily.    . vitamin B-12 (CYANOCOBALAMIN) 50 MCG tablet Take 1,000 mcg by mouth daily.      No current facility-administered medications for this visit.    Allergies as of 02/08/2016  . (No Known  Allergies)    Family History  Problem Relation Age of Onset  . Heart disease Paternal Grandfather 23  . COPD Maternal Grandmother   . Cancer Maternal Grandfather     prostate cancer & died at age 70  . Ovarian cancer Paternal Grandmother   . Healthy Father   . Healthy Mother     Social History   Social History  . Marital Status: Married    Spouse Name: N/A  . Number of Children: N/A  . Years of Education: N/A   Occupational History  . Not on file.   Social History Main Topics  . Smoking status: Never Smoker   . Smokeless tobacco: Never Used  . Alcohol Use: No  . Drug Use: No  . Sexual Activity: Not on file   Other Topics Concern  . Not on file   Social History Narrative   Lives with wife in 2 story home.  Has 1 stepdaughter.     Works at [employer].     Education: associates degree.     Physical Exam: BP 106/70 mmHg  Pulse 68  Ht 5\' 6"  (1.676 m)  Wt 163 lb (73.936 kg)  BMI 26.32 kg/m2  LMP 02/01/2016 Constitutional: generally well-appearing Psychiatric: alert and oriented x3 Eyes: extraocular movements intact Mouth: oral pharynx moist, no lesions Neck: supple no lymphadenopathy Cardiovascular: heart regular rate and rhythm  Lungs: clear to auscultation bilaterally Abdomen: soft, nontender, nondistended, no obvious ascites, no peritoneal signs, normal bowel sounds Extremities: no lower extremity edema bilaterally Skin: no lesions on visible extremities   Assessment and plan: 35 y.o. female with  Dyspepsia, globus sensation  Her symptoms have partially responded to over-the-counter strength proton pump inhibitor and so they may indeed be acid related. I recommended we increase her to prescription strength proton pump inhibitor and see how she does. She has no alarm symptoms and so I do not think she needs endoscopic evaluation at this point but if she does not respond to prescription strength proton pump inhibitor with a significant response  then we may need to proceed with that. She is also to have H. pylori serology testing done today.   Owens Loffler, MD [facility] 02/08/2016, 8:34 AM  Cc: Jake Samples, PA*

## 2016-02-08 NOTE — Patient Instructions (Signed)
Will change to protonix at 40mg  strength, once daily before breakfast. You will have labs checked today in the basement lab.  Please head down after you check out with the front desk  (H. Pylori serology antigen).

## 2016-02-09 ENCOUNTER — Ambulatory Visit: Payer: 59 | Admitting: Neurology

## 2016-02-09 ENCOUNTER — Other Ambulatory Visit: Payer: 59

## 2016-02-09 DIAGNOSIS — K219 Gastro-esophageal reflux disease without esophagitis: Secondary | ICD-10-CM

## 2016-02-10 LAB — HELICOBACTER PYLORI  SPECIAL ANTIGEN: H. PYLORI ANTIGEN STOOL: NOT DETECTED

## 2016-02-13 ENCOUNTER — Encounter: Payer: Self-pay | Admitting: Neurology

## 2016-02-13 ENCOUNTER — Telehealth: Payer: Self-pay

## 2016-02-13 ENCOUNTER — Encounter: Payer: Self-pay | Admitting: Gastroenterology

## 2016-02-13 DIAGNOSIS — E538 Deficiency of other specified B group vitamins: Secondary | ICD-10-CM

## 2016-02-13 DIAGNOSIS — R0989 Other specified symptoms and signs involving the circulatory and respiratory systems: Secondary | ICD-10-CM

## 2016-02-13 DIAGNOSIS — R198 Other specified symptoms and signs involving the digestive system and abdomen: Secondary | ICD-10-CM

## 2016-02-13 NOTE — Telephone Encounter (Signed)
Can you set her up with a barium esophagram for globus sensation. Thanks             ----- Message -----     From: Kriste Basque     Sent: 02/13/2016 11:28 AM      To: Milus Banister, MD    Subject: Visit Follow-Up Question                   ----- Message from Barron Alvine, RN sent at 02/13/2016 11:28 AM EDT -----            ----- Message from Kriste Basque to Milus Banister, MD sent at 02/13/2016 10:52 AM -----     I have been taking the protonix and had not seen an improvement with the lump feeling in the throat. I have stopped taking the protonix and treating symptoms with an OTC Zantac and that helps. The major symptom I have right now is the lump in my throat and was wondering what test we could do to see what the cause is. I had rather use the EGD as a last resort test wise. If you need to call me my number is [phone].        Thank you,        Brandan Sockwell DOB: 11-01-1980

## 2016-02-13 NOTE — Telephone Encounter (Signed)
Dr Jacobs please advise  

## 2016-02-14 ENCOUNTER — Telehealth: Payer: Self-pay | Admitting: Gastroenterology

## 2016-02-14 ENCOUNTER — Other Ambulatory Visit (INDEPENDENT_AMBULATORY_CARE_PROVIDER_SITE_OTHER): Payer: 59

## 2016-02-14 DIAGNOSIS — E538 Deficiency of other specified B group vitamins: Secondary | ICD-10-CM

## 2016-02-14 LAB — VITAMIN B12: VITAMIN B 12: 727 pg/mL (ref 211–911)

## 2016-02-14 NOTE — Telephone Encounter (Signed)
See results note. 

## 2016-02-14 NOTE — Telephone Encounter (Signed)
Please call the patient. The labs were all normal. Should continue with the suggestions outlined at recent visit.

## 2016-02-15 ENCOUNTER — Encounter: Payer: Self-pay | Admitting: *Deleted

## 2016-02-15 LAB — HOMOCYSTEINE: Homocysteine: 6.9 umol/L (ref <10.4)

## 2016-02-15 NOTE — Telephone Encounter (Signed)
Pt has been notified and instructed and verbalized understanding.  She will call with any concerns, phone number to [facility] radiology scheduling given for questions    You have been scheduled for a Barium Esophogram at [facility] (1st floor of the hospital) on 02/20/16 at 10 am. Please arrive 15 minutes prior to your appointment for registration. Make certain not to have anything to eat or drink 6 hours prior to your test. If you need to reschedule for any reason, please contact radiology at [phone] to do so. __________________________________________________________________ A barium swallow is an examination that concentrates on views of the esophagus. This tends to be a double contrast exam (barium and two liquids which, when combined, create a gas to distend the wall of the oesophagus) or single contrast (non-ionic iodine based). The study is usually tailored to your symptoms so a good history is essential. Attention is paid during the study to the form, structure and configuration of the esophagus, looking for functional disorders (such as aspiration, dysphagia, achalasia, motility and reflux) EXAMINATION You may be asked to change into a gown, depending on the type of swallow being performed. A radiologist and radiographer will perform the procedure. The radiologist will advise you of the type of contrast selected for your procedure and direct you during the exam. You will be asked to stand, sit or lie in several different positions and to hold a small amount of fluid in your mouth before being asked to swallow while the imaging is performed .In some instances you may be asked to swallow barium coated marshmallows to assess the motility of a solid food bolus. The exam can be recorded as a digital or video fluoroscopy procedure. POST PROCEDURE It will take 1-2 days for the barium to pass through your system. To facilitate this, it is important, unless otherwise directed, to increase your  fluids for the next 24-48hrs and to resume your normal diet.  This test typically takes about 30 minutes to perform. __________________________________________________________________________________

## 2016-02-16 LAB — METHYLMALONIC ACID, SERUM: Methylmalonic Acid, Quant: 116 nmol/L (ref 87–318)

## 2016-02-20 ENCOUNTER — Ambulatory Visit (HOSPITAL_COMMUNITY)
Admission: RE | Admit: 2016-02-20 | Discharge: 2016-02-20 | Disposition: A | Discharge status: Home / Self Care | Payer: 59 | Source: Ambulatory Visit | Attending: Gastroenterology | Admitting: Gastroenterology

## 2016-02-20 ENCOUNTER — Ambulatory Visit (HOSPITAL_COMMUNITY): Payer: 59

## 2016-02-20 DIAGNOSIS — F458 Other somatoform disorders: Secondary | ICD-10-CM | POA: Insufficient documentation

## 2016-02-20 DIAGNOSIS — R198 Other specified symptoms and signs involving the digestive system and abdomen: Secondary | ICD-10-CM

## 2016-02-20 DIAGNOSIS — R0989 Other specified symptoms and signs involving the circulatory and respiratory systems: Secondary | ICD-10-CM

## 2016-05-30 ENCOUNTER — Encounter: Payer: Self-pay | Admitting: Neurology

## 2016-05-31 ENCOUNTER — Encounter: Payer: Self-pay | Admitting: Cardiovascular Disease

## 2016-06-06 ENCOUNTER — Encounter: Payer: Self-pay | Admitting: Neurology

## 2016-06-06 ENCOUNTER — Ambulatory Visit (INDEPENDENT_AMBULATORY_CARE_PROVIDER_SITE_OTHER): Payer: 59 | Admitting: Neurology

## 2016-06-06 VITALS — BP 100/60 | HR 76 | Ht 66.0 in | Wt 165.0 lb

## 2016-06-06 DIAGNOSIS — R202 Paresthesia of skin: Secondary | ICD-10-CM | POA: Diagnosis not present

## 2016-06-06 DIAGNOSIS — E538 Deficiency of other specified B group vitamins: Secondary | ICD-10-CM

## 2016-06-06 NOTE — Progress Notes (Signed)
Follow-up Visit   Date: 06/06/16    Christine Compton MRN: [id number] DOB: [birth year]   Interim History: Christine Compton is a 35 y.o. right-handed Caucasian female with PCOS, insulin-dependent type 1 diabetes, and seasonal allergies returning to the clinic for follow-up of paresthesias of the face and hands.  The patient was accompanied to the clinic by self.  History of present illness: Her mother was hospitalized in December 2016 and within a few days of this, she began experiencing numbness of the face, jaw, eye, arms, hands, and sometimes in the hands and feet. The location always varies and duration can vary from 5 minutes to 20-minutes.  Initially, it was thought to be due to anxiety however her vitamin B12 was low-normal and she started vitamin 22mg which resolved symptoms.  She is not vegetarian or vegan.  She was doing well until early February, when symptoms started returned.  Now, she mostly experiences numbness/tingling involving the right elbow and hand and finger tips.  She has rare spells involving the left arm, legs, or face.  Symptoms are sporadic lasting for 2-3 days but then not occuring for several weeks.  She had identified triggers, exacerbating, or alleviating factors.  She does not have any anxiety, especially now that her mother is doing well.    She denies any weakness, gait instability, or falls.    UPDATE 06/06/2016:  She continues to have tingling occurs about 2-3 times per week, which involves the right side of her face and arm.  It mostly occurs in the evenings and usually lasts about 124m until she goes to bed.  She reduced her vitamin B12 100052mto once per week and April, and noticed that symptoms worsened during this time. She occasionally has heaviness and achy pain of her legs, worse on the right and feels better when she stretches her legs. There is no sensation of restlessness.  She does not have numbness/tingling.  She does not have any weakness or  falls.   Medications:  Current Outpatient Prescriptions on File Prior to Visit  Medication Sig Dispense Refill  . Cetirizine HCl (ZYRTEC ALLERGY) 10 MG CAPS     . Cholecalciferol (VITAMIN D) 2000 units CAPS Take 1 capsule by mouth daily.    . GMarland KitchenUCAGON EMERGENCY 1 MG injection     . Insulin Human (INSULIN PUMP) SOLN Inject into the skin as directed. Humalog insulin pump    . insulin lispro (HUMALOG) 100 UNIT/ML injection     . pantoprazole (PROTONIX) 40 MG tablet Take 1 tablet (40 mg total) by mouth daily. 30 tablet 11  . vitamin B-12 (CYANOCOBALAMIN) 50 MCG tablet Take 1,000 mcg by mouth daily.      No current facility-administered medications on file prior to visit.     Allergies: No Known Allergies  Review of Systems:  CONSTITUTIONAL: No fevers, chills, night sweats, or weight loss.  EYES: No visual changes or eye pain ENT: No hearing changes.  No history of nose bleeds.   RESPIRATORY: No cough, wheezing and shortness of breath.   CARDIOVASCULAR: Negative for chest pain, and palpitations.   GI: Negative for abdominal discomfort, blood in stools or black stools.  No recent change in bowel habits.   GU:  No history of incontinence.   MUSCLOSKELETAL: No history of joint pain or swelling.  No myalgias.   SKIN: Negative for lesions, rash, and itching.   ENDOCRINE: Negative for cold or heat intolerance, polydipsia or goiter.   PSYCH:  No  depression or anxiety symptoms.   NEURO: As Above.   Vital Signs:  There were no vitals taken for this visit. Pain Scale: 0 on a scale of 0-10  Neurological Exam: MENTAL STATUS including orientation to time, place, person, recent and remote memory, attention span and concentration, language, and fund of knowledge is normal.  Speech is not dysarthric.  CRANIAL NERVES: Pupils equal round and reactive to light.  Normal conjugate, extra-ocular eye movements in all directions of gaze.  No ptosis. Normal facial sensation.  Face is symmetric. Palate  elevates symmetrically.  Tongue is midline.  MOTOR:  Motor strength is 5/5 in all extremities.  No pronator drift.  Tone is normal.    MSRs:  Reflexes are 2+/4 throughout  SENSORY:  Intact to vibration, pin prick, and temperature throughout.  COORDINATION/GAIT:  Normal finger-to- nose-finger and heel-to-shin.  Intact rapid alternating movements bilaterally.  Gait narrow based and stable.   Data: Labs 10/25/2015:  HbA1c 7.9*, vitamin B2 208, TSH 0.72 Lab Results  Component Value Date   [id number] 233 02/14/2016    IMPRESSION/PLAN: Christine Compton is a 35 year-old female returning for evaluation of right sided face and arm paresthesias.  Her neurological exam is entirely normal and non-focal.  I reassured her that she does not have any evidence of anything worrisome.  Since her symptoms got worse after reducing her vitamin B12 to once per week, I will restart her on vitamin B12 1025mg daily. If no improvement with a month, check vitamin B12, ANA, ESR, TSH, and consider MRI brain without contrast.  Her leg symptoms are more suggestive of muscle tightness/strain.  Leg stretches were recommended.   She will contact me with an update in 1 month.   The duration of this appointment visit was 30 minutes of face-to-face time with the patient.  Greater than 50% of this time was spent in counseling, explanation of diagnosis, planning of further management, and coordination of care.   Thank you for allowing me to participate in patient's care.  If I can answer any additional questions, I would be pleased to do so.    Sincerely,    Donika K. PPosey Pronto DO

## 2016-06-06 NOTE — Patient Instructions (Addendum)
Restart vitamin B12 1068mcg daily Call with an update in month, will need to do additional labs and MRI brain

## 2016-06-18 LAB — HM PAP SMEAR: HM Pap smear: NEGATIVE

## 2016-08-17 ENCOUNTER — Encounter: Payer: Self-pay | Admitting: Family Medicine

## 2016-08-17 ENCOUNTER — Ambulatory Visit (INDEPENDENT_AMBULATORY_CARE_PROVIDER_SITE_OTHER): Payer: 59 | Admitting: Family Medicine

## 2016-08-17 VITALS — BP 128/82 | HR 89 | Temp 98.2°F | Ht 66.0 in | Wt 167.0 lb

## 2016-08-17 DIAGNOSIS — E538 Deficiency of other specified B group vitamins: Secondary | ICD-10-CM | POA: Diagnosis not present

## 2016-08-17 DIAGNOSIS — Z23 Encounter for immunization: Secondary | ICD-10-CM | POA: Diagnosis not present

## 2016-08-17 DIAGNOSIS — Z794 Long term (current) use of insulin: Secondary | ICD-10-CM

## 2016-08-17 DIAGNOSIS — E559 Vitamin D deficiency, unspecified: Secondary | ICD-10-CM

## 2016-08-17 DIAGNOSIS — R5383 Other fatigue: Secondary | ICD-10-CM

## 2016-08-17 DIAGNOSIS — J302 Other seasonal allergic rhinitis: Secondary | ICD-10-CM | POA: Insufficient documentation

## 2016-08-17 DIAGNOSIS — R0789 Other chest pain: Secondary | ICD-10-CM

## 2016-08-17 DIAGNOSIS — E119 Type 2 diabetes mellitus without complications: Secondary | ICD-10-CM | POA: Diagnosis not present

## 2016-08-17 DIAGNOSIS — E282 Polycystic ovarian syndrome: Secondary | ICD-10-CM

## 2016-08-17 LAB — CBC
HEMATOCRIT: 42.8 % (ref 36.0–46.0)
HEMOGLOBIN: 14.5 g/dL (ref 12.0–15.0)
MCHC: 33.8 g/dL (ref 30.0–36.0)
MCV: 85.4 fl (ref 78.0–100.0)
Platelets: 268 10*3/uL (ref 150.0–400.0)
RBC: 5.01 Mil/uL (ref 3.87–5.11)
RDW: 13 % (ref 11.5–15.5)
WBC: 8.4 10*3/uL (ref 4.0–10.5)

## 2016-08-17 LAB — COMPREHENSIVE METABOLIC PANEL
ALT: 9 U/L (ref 0–35)
AST: 12 U/L (ref 0–37)
Albumin: 4.2 g/dL (ref 3.5–5.2)
Alkaline Phosphatase: 88 U/L (ref 39–117)
BUN: 17 mg/dL (ref 6–23)
CHLORIDE: 101 meq/L (ref 96–112)
CO2: 30 mEq/L (ref 19–32)
Calcium: 9.3 mg/dL (ref 8.4–10.5)
Creatinine, Ser: 0.85 mg/dL (ref 0.40–1.20)
GFR: 81.65 mL/min (ref >60.00)
GLUCOSE: 297 mg/dL — AB (ref 70–99)
POTASSIUM: 4.3 meq/L (ref 3.5–5.1)
SODIUM: 137 meq/L (ref 135–145)
Total Bilirubin: 0.3 mg/dL (ref 0.2–1.2)
Total Protein: 7.3 g/dL (ref 6.0–8.3)

## 2016-08-17 LAB — MICROALBUMIN / CREATININE URINE RATIO
CREATININE, U: 33.3 mg/dL
MICROALB/CREAT RATIO: 2.1 mg/g (ref 0.0–30.0)
Microalb, Ur: <0.7 mg/dL (ref 0.0–1.9)

## 2016-08-17 LAB — HEMOGLOBIN A1C: Hgb A1c MFr Bld: 7.7 % — ABNORMAL HIGH (ref 4.6–6.5)

## 2016-08-17 LAB — LDL CHOLESTEROL, DIRECT: LDL DIRECT: 109 mg/dL

## 2016-08-17 NOTE — Patient Instructions (Addendum)
Health Maintenance Due  Topic Date Due  . HEMOGLOBIN A1C, urine microalbumin- having visit in December with Dr. Buddy Duty- have him send me records 07/06/1983  . PNEUMOCOCCAL POLYSACCHARIDE VACCINE (1)- declined for now 03/11/1985  . FOOT EXAM - normal today including pulses 03/11/1993  . OPHTHALMOLOGY EXAM - Sign release of information at the check out desk for records from last eye exam 03/11/1993  . TETANUS/TDAP- today   03/11/2002  . PAP SMEAR - Sign release of information at the check out desk for last pap smear only from august 03/11/2004   Labs before you leave  See me at least yearly but happy to check in every 6 months as well

## 2016-08-17 NOTE — Progress Notes (Signed)
Pre visit review using our clinic review tool, if applicable. No additional management support is needed unless otherwise documented below in the visit note. 

## 2016-08-17 NOTE — Progress Notes (Signed)
Phone: [phone]  Subjective:  Patient presents today to establish care as prior patient of [facility]. Chief complaint-noted.   See problem oriented charting  The following were reviewed and entered/updated in epic: Past Medical History:  Diagnosis Date  . Diabetes mellitus without complication ([facility])    Dr. Buddy Duty with [facility]. On insulin pump. Dexcom sensor that talks to the pump  . PCOS (polycystic ovarian syndrome)    metformin and actos in the past- didnt help that much. shaves daily.   . Seasonal allergies    zyrtec   Patient Active Problem List   Diagnosis Date Noted  . Type 1 diabetes mellitus ([place]) 01/20/2016    Priority: High  . PCOS (polycystic ovarian syndrome)     Priority: Medium  . Low vitamin B12 level 01/20/2016    Priority: Medium  . Vitamin D deficiency 08/18/2016    Priority: Low  . Seasonal allergies     Priority: Low  . Long term current use of insulin ([place]) 10/25/2015    Priority: Low  . Atypical chest pain 08/18/2016   Past Surgical History:  Procedure Laterality Date  . OVARIAN CYST REMOVAL  2013    Family History  Problem Relation Age of Onset  . Heart disease Paternal Grandfather 64    heart disease related  . Prostate cancer Paternal Grandfather   . Healthy Mother   . Hyperlipidemia Father     monitoring at present  . COPD Maternal Grandmother     never smoker- worked in [employer]  . Arthritis Maternal Grandmother   . Cancer Maternal Grandfather     prostate cancer & died at age 68  . Ovarian cancer Paternal Grandmother   . Lung cancer Maternal Uncle     smoker  . CAD Maternal Uncle     massive MI under age 55    Medications- reviewed and updated Current Outpatient Prescriptions  Medication Sig Dispense Refill  . Cetirizine HCl (ZYRTEC ALLERGY) 10 MG CAPS     . Cholecalciferol (VITAMIN D) 2000 units CAPS Take 1 capsule by mouth daily.    Marland Kitchen GLUCAGON EMERGENCY 1 MG injection     . Insulin  Human (INSULIN PUMP) SOLN Inject into the skin as directed. Humalog insulin pump    . insulin lispro (HUMALOG) 100 UNIT/ML injection     . vitamin B-12 (CYANOCOBALAMIN) 50 MCG tablet Take 1,000 mcg by mouth daily.      Allergies-reviewed and updated No Known Allergies  Social History   Social History  . Marital status: Married    Spouse name: N/A  . Number of children: N/A  . Years of education: N/A   Social History Main Topics  . Smoking status: Never Smoker  . Smokeless tobacco: Never Used  . Alcohol use No  . Drug use: No  . Sexual activity: Not Asked   Other Topics Concern  . None   Social History Narrative   Lives with wife in 2 story home.  Has 1 stepdaughter- not in the home.       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    Education: associates degree. [employer].       Hobbies: time with dogs ( 2 small chihuahuas and getting doberman puppy)    ROS--Full ROS was completed Review of Systems  Constitutional: Positive for malaise/fatigue. Negative for chills, fever and weight loss.  HENT: Negative for hearing loss.  Eyes: Negative for blurred vision and double vision.  Respiratory: Negative for cough, hemoptysis, sputum production, shortness of breath and wheezing.   Cardiovascular: Positive for chest pain. Negative for palpitations, orthopnea, claudication and leg swelling.  Gastrointestinal: Negative for abdominal pain, nausea and vomiting.  Genitourinary: Negative for dysuria and urgency.  Musculoskeletal: Negative for myalgias and neck pain.  Skin: Negative for itching and rash.  Neurological: Positive for tingling. Negative for dizziness, tremors, weakness and headaches.  Endo/Heme/Allergies: Negative for polydipsia. Does not bruise/bleed easily.  Psychiatric/Behavioral: Negative for hallucinations and substance abuse.   Objective: BP 128/82 (BP Location: Left Arm,  Patient Position: Sitting, Cuff Size: Normal)   Pulse 89   Temp 98.2 F (36.8 C) (Oral)   Ht 5\' 6"  (1.676 m)   Wt 167 lb (75.8 kg)   LMP 07/23/2016   SpO2 97%   BMI 26.95 kg/m  Gen: NAD, resting comfortably HEENT: Mucous membranes are moist. Oropharynx normal. TM normal. Eyes: sclera and lids normal, PERRLA Neck: no thyromegaly, no cervical lymphadenopathy CV: RRR no murmurs rubs or gallops Lungs: CTAB no crackles, wheeze, rhonchi Abdomen: soft/nontender/nondistended/normal bowel sounds. No rebound or guarding.  Ext: no edema Skin: warm, dry Neuro: 5/5 strength in upper and lower extremities, normal gait, normal reflexes. Did not complete full neuro exam as recently done by neurology  Diabetic Foot Exam - Simple   Simple Foot Form Diabetic Foot exam was performed with the following findings:  Yes 08/18/2016  9:36 AM  Visual Inspection No deformities, no ulcerations, no other skin breakdown bilaterally:  Yes Sensation Testing Intact to touch and monofilament testing bilaterally:  Yes Pulse Check Posterior Tibialis and Dorsalis pulse intact bilaterally:  Yes Comments     Assessment/Plan:  Type 1 diabetes mellitus ([place]) S: Patient states last a1c with Dr. Buddy Duty was 7.9 in February. States she is encouraged to come every 3-4 months but usually goes twice a year. Managed with insulin pump.  A/P: updated foot exam today- get records on eye exam. Also update a1c to potentially save her a stick- and encouraged follow up with Dr. Buddy Duty (schedule now as already past 6 months she mentioned). Also get urine microalbumin/cr ratio  We also discussed lifestyle and weight management. She does not always do well with exercise but Better with exercise in good weather- mile 5 days a week on lunch hour. Also hopes will go up more with new puppy doberman. Encouraged 150 minutes a week.   Low vitamin B12 level S: Patient was having some issues with numbness tingling in lower and upper body. Saw  Dr. Posey Pronto and low bUpper arm and back of neck tingling- slowly improving. Still greater on the right side. Never had scan. Not thought to be mass or CVA related per patient in her discussions with neurology or prior PCP. Since it has been improving- monitoring only at this point. 12 discovered- started on replacement and as #s have improved- her symptoms have drastically improved. Now down to just some tingling in  A/P: continue b12 1077mcg a day- update b12 level today- as long as numbness/tingling improving- will simply continue to monitor at this point- if worsens return to care or to Dr. Posey Pronto to look into other potential causes.   PCOS (polycystic ovarian syndrome) S: excess hair growth and has to shave daily. metformin and actos in the past- didnt help that much. Periods are about every 35 days and pretty consistent lately A/P: did ask patient to discuss with GYN Dr. Benjie Karvonen if it  is felt that she ovulates because risk of unopposed estrogen for uterine cancer- she states she will discuss this at follow up   Vitamin D deficiency S: reports level in 20s in past. Now on 2000 IU a day- no recent checks A/P: update vitamin D today   Atypical chest pain Fatigue S: Patient also for at least several months if not longer has had some chest pain- Left or right occasional pain. Lasts from 5 minutes to a few hours. Nonexertional. Almost feels like a sore muscle. Mild. No problems with exercise.   Also with some fatigue even with 8 hours of sleep. Behind desk 8 hours. Exercise has not helped so much- walking a mile a day. For several years at least 3-4. Not worsening but no recent labs.  A/P: Patients cardiovascular age is likely higher than physical age of 7 with diabetes. Despite this her chest pain sounds like it is chest wall pain- she also mentions it feels like gas at times. Reflux would be another possibility. Low probability of cardiac though discussed if new or worsening symptoms to seek care so we  can reassess and especially if exertional as she increases her exercise  Update basic labs in regards to fatigue.    Advised to see me at least once a year.   Orders Placed This Encounter  Procedures  . Tdap vaccine greater than or equal to 7yo IM  . CBC    [place]  . Comprehensive metabolic panel    [place]  . LDL cholesterol, direct    [place]  . Hemoglobin A1c    [place]  . TSH    [place]  . VITAMIN D 25 Hydroxy (Vit-D Deficiency, Fractures)    [place]  . Vitamin B12  . Microalbumin / creatinine urine ratio       Return precautions advised.  Garret Reddish, MD

## 2016-08-18 DIAGNOSIS — R0789 Other chest pain: Secondary | ICD-10-CM | POA: Insufficient documentation

## 2016-08-18 DIAGNOSIS — E559 Vitamin D deficiency, unspecified: Secondary | ICD-10-CM | POA: Insufficient documentation

## 2016-08-18 NOTE — Assessment & Plan Note (Addendum)
S: Patient states last a1c with Dr. Buddy Duty was 7.9 in February. States she is encouraged to come every 3-4 months but usually goes twice a year. Managed with insulin pump.  A/P: updated foot exam today- get records on eye exam. Also update a1c to potentially save her a stick- and encouraged follow up with Dr. Buddy Duty (schedule now as already past 6 months she mentioned). Also get urine microalbumin/cr ratio  We also discussed lifestyle and weight management. She does not always do well with exercise but Better with exercise in good weather- mile 5 days a week on lunch hour. Also hopes will go up more with new puppy doberman. Encouraged 150 minutes a week.

## 2016-08-18 NOTE — Assessment & Plan Note (Addendum)
Fatigue S: Patient also for at least several months if not longer has had some chest pain- Left or right occasional pain. Lasts from 5 minutes to a few hours. Nonexertional. Almost feels like a sore muscle. Mild. No problems with exercise. No shortness of breath, nausea, diaphoresis, left arm or neck pain with this. Alternates from side to side but never central chest pain  Also with some fatigue even with 8 hours of sleep. Behind desk 8 hours. Exercise has not helped so much- walking a mile a day. For several years at least 3-4. Not worsening but no recent labs.  A/P: Patients cardiovascular age is likely higher than physical age of 31 with diabetes. Despite this her chest pain sounds like it is chest wall pain- she also mentions it feels like gas at times. Reflux would be another possibility. Low probability of cardiac though discussed if new or worsening symptoms to seek care so we can reassess and especially if exertional as she increases her exercise  Update basic labs in regards to fatigue.

## 2016-08-18 NOTE — Assessment & Plan Note (Signed)
S: Patient was having some issues with numbness tingling in lower and upper body. Saw Dr. Posey Pronto and low bUpper arm and back of neck tingling- slowly improving. Still greater on the right side. Never had scan. Not thought to be mass or CVA related per patient in her discussions with neurology or prior PCP. Since it has been improving- monitoring only at this point. 12 discovered- started on replacement and as #s have improved- her symptoms have drastically improved. Now down to just some tingling in  A/P: continue b12 1021mcg a day- update b12 level today- as long as numbness/tingling improving- will simply continue to monitor at this point- if worsens return to care or to Dr. Posey Pronto to look into other potential causes.

## 2016-08-18 NOTE — Assessment & Plan Note (Signed)
S: reports level in 20s in past. Now on 2000 IU a day- no recent checks A/P: update vitamin D today

## 2016-08-18 NOTE — Assessment & Plan Note (Signed)
S: excess hair growth and has to shave daily. metformin and actos in the past- didnt help that much. Periods are about every 35 days and pretty consistent lately A/P: did ask patient to discuss with GYN Dr. Benjie Karvonen if it is felt that she ovulates because risk of unopposed estrogen for uterine cancer- she states she will discuss this at follow up

## 2016-08-20 ENCOUNTER — Other Ambulatory Visit: Payer: Self-pay | Admitting: Family Medicine

## 2016-08-20 LAB — VITAMIN B12: VITAMIN B 12: 853 pg/mL (ref 211–911)

## 2016-08-20 LAB — VITAMIN D 25 HYDROXY (VIT D DEFICIENCY, FRACTURES): VITD: 23.47 ng/mL — AB (ref 30.00–100.00)

## 2016-08-20 LAB — TSH: TSH: 0.76 u[IU]/mL (ref 0.35–4.50)

## 2016-08-20 MED ORDER — CHOLECALCIFEROL 1.25 MG (50000 UT) PO CAPS: 50000.0000 [IU] | ORAL_CAPSULE | ORAL | 12 refills | Status: DC

## 2016-08-20 NOTE — Progress Notes (Signed)
Vitamin d

## 2016-08-21 ENCOUNTER — Telehealth: Payer: Self-pay | Admitting: Family Medicine

## 2016-08-21 NOTE — Telephone Encounter (Signed)
Pt is returning [employer]

## 2016-08-22 ENCOUNTER — Other Ambulatory Visit: Payer: Self-pay

## 2016-08-22 ENCOUNTER — Encounter: Payer: Self-pay | Admitting: Family Medicine

## 2016-08-22 DIAGNOSIS — R7989 Other specified abnormal findings of blood chemistry: Secondary | ICD-10-CM

## 2016-08-22 NOTE — Telephone Encounter (Signed)
Spoke with patient regarding lab results. 

## 2016-08-22 NOTE — Telephone Encounter (Signed)
Pt is returning Christine Compton call

## 2016-08-31 ENCOUNTER — Ambulatory Visit: Payer: 59 | Admitting: Neurology

## 2016-09-19 ENCOUNTER — Encounter: Payer: Self-pay | Admitting: Family Medicine

## 2016-10-05 ENCOUNTER — Ambulatory Visit (INDEPENDENT_AMBULATORY_CARE_PROVIDER_SITE_OTHER): Payer: 59 | Admitting: Cardiovascular Disease

## 2016-10-05 ENCOUNTER — Encounter: Payer: Self-pay | Admitting: Cardiovascular Disease

## 2016-10-05 VITALS — BP 104/66 | HR 89 | Ht 66.0 in | Wt 165.0 lb

## 2016-10-05 DIAGNOSIS — R9431 Abnormal electrocardiogram [ECG] [EKG]: Secondary | ICD-10-CM

## 2016-10-05 DIAGNOSIS — K219 Gastro-esophageal reflux disease without esophagitis: Secondary | ICD-10-CM | POA: Diagnosis not present

## 2016-10-05 DIAGNOSIS — R072 Precordial pain: Secondary | ICD-10-CM

## 2016-10-05 DIAGNOSIS — R0789 Other chest pain: Secondary | ICD-10-CM

## 2016-10-05 NOTE — Progress Notes (Signed)
SUBJECTIVE: When I last saw this patient on 12/09/15, I instructed her to follow-up as needed. She has a history of chest fullness which I deemed noncardiac in etiology and likely related to GERD and recommended gastroenterology follow-up. She also had an abnormal ECG which I found to be a benign finding with no further investigations needed.  She has type 1 diabetes and polycystic ovarian syndrome.  ECG performed today shows normal sinus rhythm with a nonspecific ST segment abnormality.  Protonix made her symptoms worse but Pepcid and Prilosec have helped alleviate her symptoms. She tries to avoid eating deep-fried foods, red meats, onions, tomatoes, and chocolate on a regular basis. She denies exertional chest pain. She has mild occasional exertional dyspnea which she attributes to cardiovascular deconditioning. She denies palpitations.  Review of Systems: As per "subjective", otherwise negative.  No Known Allergies  Current Outpatient Prescriptions  Medication Sig Dispense Refill  . Cetirizine HCl (ZYRTEC ALLERGY) 10 MG CAPS     . Cholecalciferol (VITAMIN D) 2000 units CAPS Take 1 capsule by mouth daily.    . Cholecalciferol 50000 units capsule Take 1 capsule (50,000 Units total) by mouth once a week. 12 capsule 12  . GLUCAGON EMERGENCY 1 MG injection     . Insulin Human (INSULIN PUMP) SOLN Inject into the skin as directed. Humalog insulin pump    . insulin lispro (HUMALOG) 100 UNIT/ML injection     . vitamin B-12 (CYANOCOBALAMIN) 50 MCG tablet Take 1,000 mcg by mouth daily.     . Vitamin D, Ergocalciferol, (DRISDOL) 50000 units CAPS capsule Take 50,000 Units by mouth every 7 (seven) days.     No current facility-administered medications for this visit.     Past Medical History:  Diagnosis Date  . Diabetes mellitus without complication (HCC)    Dr. Buddy Duty with [facility]. On insulin pump. Dexcom sensor that talks to the pump  . PCOS (polycystic ovarian syndrome)    metformin and actos in the past- didnt help that much. shaves daily.   . Seasonal allergies    zyrtec    Past Surgical History:  Procedure Laterality Date  . OVARIAN CYST REMOVAL  2013    Social History   Social History  . Marital status: Married    Spouse name: N/A  . Number of children: N/A  . Years of education: N/A   Occupational History  . Not on file.   Social History Main Topics  . Smoking status: Never Smoker  . Smokeless tobacco: Never Used  . Alcohol use No  . Drug use: No  . Sexual activity: Not on file   Other Topics Concern  . Not on file   Social History Narrative   Lives with wife in 2 story home.  Has 1 stepdaughter- not in the home.       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    Education: associates degree. [employer].       Hobbies: time with dogs ( 2 small chihuahuas and getting doberman puppy)     Vitals:   10/05/16 1600  BP: 104/66  Pulse: 89  SpO2: 98%  Weight: 165 lb (74.8 kg)  Height: 5\' 6"  (1.676 m)    PHYSICAL EXAM General: NAD HEENT: Normal. Neck: No JVD, no thyromegaly. Lungs: Clear to auscultation bilaterally with normal respiratory effort. CV: Nondisplaced PMI.  Regular rate and rhythm, normal S1/S2, no S3/S4,  no murmur. No pretibial or periankle edema.    Abdomen: Soft, nontender, no distention.  Neurologic: Alert and oriented.  Psych: Normal affect. Skin: Normal. Musculoskeletal: No gross deformities.    ECG: Most recent ECG reviewed.      ASSESSMENT AND PLAN: 1. Chest fullness: Noncardiac and likely related to GERD. Continue to avoid foods which trigger symptoms and use Prilosec and Pepcid as needed.  2. "Abnormal ECG": Benign finding and asymptomatic. Specifically, no exertional chest tightness. No further investigations are warranted.  3. GERD: See #1.  Dispo: f/u prn.   Kate Sable, M.D., F.A.C.C.

## 2016-10-05 NOTE — Patient Instructions (Addendum)
Your physician recommends that you schedule a follow-up appointment in: as needed   Thank you for choosing [facility] !         

## 2016-11-01 ENCOUNTER — Encounter: Payer: Self-pay | Admitting: Nurse Practitioner

## 2016-11-01 ENCOUNTER — Ambulatory Visit (INDEPENDENT_AMBULATORY_CARE_PROVIDER_SITE_OTHER): Payer: 59 | Admitting: Nurse Practitioner

## 2016-11-01 VITALS — BP 90/62 | HR 80 | Ht 66.0 in | Wt 167.4 lb

## 2016-11-01 DIAGNOSIS — R0789 Other chest pain: Secondary | ICD-10-CM | POA: Diagnosis not present

## 2016-11-01 DIAGNOSIS — R079 Chest pain, unspecified: Secondary | ICD-10-CM

## 2016-11-01 NOTE — Patient Instructions (Signed)
Continuous Beano and Maalox as needed.   Call to schedule an endoscopy if recurrent symptoms.  We have given you a reflux handout.

## 2016-11-01 NOTE — Progress Notes (Signed)
     HPI: Patient is a 36 year old female known to Dr. Ardis Hughs for history of GERD. She was seen April 2017 with complaints of fullness in the top of her stomach relieved by belching. She describes globus sensation as well. Barium swallow with tablet was done and normal.  Patient is here for evaluation of chest pain. Pain is nonexertional . No associated cough or shortness of breath . Pain started December 2016 when mother went for emergent surgery. Patient initially thought pain was related to stress and/or GERD . She began having numbness and some of her extremities which made her even more suspicious that stress or anxiety was contributing to her symptoms. Patient saw a neurologist, apparently diagnosed with a B12 deficiency and paresthesia resolved with B12 supplements. Chest pain continued despite trial of PPI. Patient has been evaluated by cardiology who referred her back to [place] for noncardiac chest pain. The chest pain is usually dull, feels muscular but at times can be stabbing. Pain is not related to eating. Patient does not feel anxious or stressed out. In addition to a PPI she's also tried H2 blockers without relief. Interestingly though, Maalox tends to help. Last week patient started being and for the last couple of days has not had any pain. She is also noticed decreased belching. No dysphasia. No globus sensation. Occasionally patient feels like she has a tennis ball stuck in the epigastrium, she has had this before.   Past Medical History:  Diagnosis Date  . Diabetes mellitus without complication (HCC)    Dr. Buddy Duty with [facility]. On insulin pump. Dexcom sensor that talks to the pump  . PCOS (polycystic ovarian syndrome)    metformin and actos in the past- didnt help that much. shaves daily.   . Seasonal allergies    zyrtec    Patient's surgical history, family medical history, social history, medications and allergies were all reviewed in Epic   Physical Exam: BP 90/62    Pulse 80   Ht 5\' 6"  (1.676 m)   Wt 167 lb 6.4 oz (75.9 kg)   LMP 09/06/2016   BMI 27.02 kg/m   GENERAL: well developed white female in NAD PSYCH: :Pleasant, cooperative, normal affect HEENT: Normocephalic, conjunctiva pink, mucous membranes moist, neck supple without masses CARDIAC:  RRR, no murmur heard, no peripheral edema PULM: Normal respiratory effort, lungs CTA bilaterally, no wheezing ABDOMEN:  soft, nontender, nondistended, no obvious masses, no hepatomegaly,  normal bowel sounds SKIN:  turgor, no lesions seen Musculoskeletal:  Normal muscle tone, normal strength. No chest wall tenderness NEURO: Alert and oriented x 3, no focal neurologic deficits  ASSESSMENT and PLAN:  24.  36 year old female with chronic intermittent, non-exertional chest pain unrelieved with PPI / H2 blockers. Maalox and beano help.  Negative cardiac evaluation. Patient appears healthy. No unexplained weight loss, no dysphagia and normal barium swallow a few months ago. Could still be GERD since Maalox helps.  Other considerations: musculoskeletal pain, anxiety related?  -GERD literature given.  -She is going to remain off PPI / H2 blockers since they didn't help. Continue Maalox and Beano as needed.  -We discussed EGD, she understands that it would probably be low yield but if pain recurs then will continue to   investigate and EGD is probably next reasonable step.  2. Type I DM, has insulin pump  Tye Savoy , NP 11/01/2016, 4:21 PM

## 2016-11-02 NOTE — Progress Notes (Signed)
I agree with the above note, plan 

## 2016-11-21 ENCOUNTER — Other Ambulatory Visit (INDEPENDENT_AMBULATORY_CARE_PROVIDER_SITE_OTHER): Payer: 59

## 2016-11-21 ENCOUNTER — Other Ambulatory Visit: Payer: 59

## 2016-11-21 DIAGNOSIS — E559 Vitamin D deficiency, unspecified: Secondary | ICD-10-CM

## 2016-11-21 DIAGNOSIS — R7989 Other specified abnormal findings of blood chemistry: Secondary | ICD-10-CM

## 2016-11-24 ENCOUNTER — Other Ambulatory Visit: Payer: Self-pay | Admitting: Family Medicine

## 2016-11-24 LAB — VITAMIN D 1,25 DIHYDROXY
VITAMIN D 1, 25 (OH) TOTAL: 27 pg/mL (ref 18–72)
VITAMIN D3 1, 25 (OH): 27 pg/mL
Vitamin D2 1, 25 (OH)2: <8 pg/mL

## 2016-11-30 ENCOUNTER — Other Ambulatory Visit: Payer: Self-pay

## 2016-11-30 DIAGNOSIS — R7989 Other specified abnormal findings of blood chemistry: Secondary | ICD-10-CM

## 2017-01-01 ENCOUNTER — Encounter: Payer: Self-pay | Admitting: Family Medicine

## 2017-01-01 ENCOUNTER — Other Ambulatory Visit: Payer: Self-pay

## 2017-01-01 MED ORDER — CETIRIZINE HCL 10 MG PO CAPS: 1.0000 | ORAL_CAPSULE | Freq: Every day | ORAL | 3 refills | Status: DC

## 2017-02-05 DIAGNOSIS — E109 Type 1 diabetes mellitus without complications: Secondary | ICD-10-CM | POA: Diagnosis not present

## 2017-02-06 ENCOUNTER — Telehealth: Payer: Self-pay | Admitting: Family Medicine

## 2017-02-06 NOTE — Telephone Encounter (Signed)
° ° ° °  Pt call to ask when Dr Yong Channel would like her to have her Vitamin D recheck. I told her according to the order he has placed he would for her to have it by 02/27/17. She ask even if it is before she finish the medicine. Please verify and I will call and get her scheduled

## 2017-02-07 NOTE — Telephone Encounter (Signed)
Pt can be scheduled at the end of May

## 2017-02-08 NOTE — Telephone Encounter (Signed)
° ° ° ° °  lmovm to call and schedule a lab VIT D

## 2017-02-11 DIAGNOSIS — E109 Type 1 diabetes mellitus without complications: Secondary | ICD-10-CM | POA: Diagnosis not present

## 2017-02-11 LAB — HM DIABETES EYE EXAM

## 2017-02-26 ENCOUNTER — Other Ambulatory Visit: Payer: 59

## 2017-02-27 ENCOUNTER — Other Ambulatory Visit (INDEPENDENT_AMBULATORY_CARE_PROVIDER_SITE_OTHER): Payer: 59

## 2017-02-27 DIAGNOSIS — E559 Vitamin D deficiency, unspecified: Secondary | ICD-10-CM

## 2017-02-27 DIAGNOSIS — R7989 Other specified abnormal findings of blood chemistry: Secondary | ICD-10-CM

## 2017-02-27 LAB — VITAMIN D 25 HYDROXY (VIT D DEFICIENCY, FRACTURES): VITD: 41.12 ng/mL (ref 30.00–100.00)

## 2017-04-16 DIAGNOSIS — E1065 Type 1 diabetes mellitus with hyperglycemia: Secondary | ICD-10-CM | POA: Diagnosis not present

## 2017-05-17 DIAGNOSIS — E109 Type 1 diabetes mellitus without complications: Secondary | ICD-10-CM | POA: Diagnosis not present

## 2017-06-03 ENCOUNTER — Telehealth: Payer: Self-pay | Admitting: Family Medicine

## 2017-06-03 DIAGNOSIS — E109 Type 1 diabetes mellitus without complications: Secondary | ICD-10-CM | POA: Diagnosis not present

## 2017-06-03 DIAGNOSIS — E559 Vitamin D deficiency, unspecified: Secondary | ICD-10-CM | POA: Diagnosis not present

## 2017-06-03 NOTE — Telephone Encounter (Signed)
Pt needs 4 pm for physical. Pt is dm and will come back to do blood work. Can I sch? Pt is not due until late oct 2018

## 2017-06-04 DIAGNOSIS — E109 Type 1 diabetes mellitus without complications: Secondary | ICD-10-CM | POA: Diagnosis not present

## 2017-06-04 LAB — HEMOGLOBIN A1C: Hemoglobin A1C: 7.3

## 2017-06-04 NOTE — Telephone Encounter (Signed)
Pt has been sch

## 2017-06-04 NOTE — Telephone Encounter (Signed)
That will be fine. 

## 2017-06-05 ENCOUNTER — Encounter: Payer: Self-pay | Admitting: Family Medicine

## 2017-06-25 ENCOUNTER — Encounter: Payer: Self-pay | Admitting: Family Medicine

## 2017-06-25 ENCOUNTER — Other Ambulatory Visit: Payer: Self-pay

## 2017-06-25 ENCOUNTER — Telehealth: Payer: Self-pay | Admitting: Family Medicine

## 2017-06-25 MED ORDER — CETIRIZINE HCL 10 MG PO CAPS: 1.0000 | ORAL_CAPSULE | Freq: Every day | ORAL | 3 refills | Status: DC

## 2017-06-25 NOTE — Telephone Encounter (Signed)
Sent to the pharmacy as requested. I sent  My Chart message informing patient

## 2017-06-25 NOTE — Telephone Encounter (Signed)
Pt need new Rx for cetirizine HCI (ZYRTEC ALLERGY)   Pharm:  [facility] in [place]  Pt state that she is out of the medication and would like to see if she could get this today.

## 2017-06-26 DIAGNOSIS — Z01419 Encounter for gynecological examination (general) (routine) without abnormal findings: Secondary | ICD-10-CM | POA: Diagnosis not present

## 2017-07-26 DIAGNOSIS — Z23 Encounter for immunization: Secondary | ICD-10-CM | POA: Diagnosis not present

## 2017-07-31 DIAGNOSIS — E1065 Type 1 diabetes mellitus with hyperglycemia: Secondary | ICD-10-CM | POA: Diagnosis not present

## 2017-08-14 DIAGNOSIS — E109 Type 1 diabetes mellitus without complications: Secondary | ICD-10-CM | POA: Diagnosis not present

## 2017-08-26 ENCOUNTER — Encounter: Payer: Self-pay | Admitting: Family Medicine

## 2017-08-26 ENCOUNTER — Ambulatory Visit (INDEPENDENT_AMBULATORY_CARE_PROVIDER_SITE_OTHER): Payer: 59 | Admitting: Family Medicine

## 2017-08-26 VITALS — BP 118/72 | HR 67 | Temp 98.3°F | Ht 66.0 in | Wt 170.6 lb

## 2017-08-26 DIAGNOSIS — Z0000 Encounter for general adult medical examination without abnormal findings: Secondary | ICD-10-CM | POA: Diagnosis not present

## 2017-08-26 DIAGNOSIS — E559 Vitamin D deficiency, unspecified: Secondary | ICD-10-CM

## 2017-08-26 DIAGNOSIS — E109 Type 1 diabetes mellitus without complications: Secondary | ICD-10-CM | POA: Diagnosis not present

## 2017-08-26 DIAGNOSIS — E282 Polycystic ovarian syndrome: Secondary | ICD-10-CM

## 2017-08-26 DIAGNOSIS — E538 Deficiency of other specified B group vitamins: Secondary | ICD-10-CM | POA: Diagnosis not present

## 2017-08-26 NOTE — Progress Notes (Signed)
Phone: [phone]  Subjective:  Patient presents today for their annual physical. Chief complaint-noted.   See problem oriented charting- ROS- full  review of systems was completed and negative except for: difficulty gaining wait, hirsutism  The following were reviewed and entered/updated in epic: Past Medical History:  Diagnosis Date  . Diabetes mellitus without complication (HCC)    Dr. Buddy Duty with [facility]. On insulin pump. Dexcom sensor that talks to the pump  . PCOS (polycystic ovarian syndrome)    metformin and actos in the past- didnt help that much. shaves daily.   . Seasonal allergies    zyrtec   Patient Active Problem List   Diagnosis Date Noted  . Type 1 diabetes mellitus ([place]) 01/20/2016    Priority: High  . PCOS (polycystic ovarian syndrome)     Priority: Medium  . Low vitamin B12 level 01/20/2016    Priority: Medium  . Vitamin D deficiency 08/18/2016    Priority: Low  . Seasonal allergies     Priority: Low  . Long term current use of insulin ([place]) 10/25/2015    Priority: Low  . Atypical chest pain 08/18/2016   Past Surgical History:  Procedure Laterality Date  . OVARIAN CYST REMOVAL  2013    Family History  Problem Relation Age of Onset  . Heart disease Paternal Grandfather 40       heart disease related  . Prostate cancer Paternal Grandfather   . Healthy Mother   . Hyperlipidemia Father        monitoring at present  . COPD Maternal Grandmother        never smoker- worked in [employer]  . Arthritis Maternal Grandmother   . Cancer Maternal Grandfather        prostate cancer & died at age 54  . Ovarian cancer Paternal Grandmother   . Lung cancer Maternal Uncle        smoker  . CAD Maternal Uncle        massive MI under age 41  . Colon cancer Neg Hx   . Pancreatic cancer Neg Hx   . Stomach cancer Neg Hx   . Esophageal cancer Neg Hx     Medications- reviewed and updated Current Outpatient Medications  Medication Sig Dispense Refill    . Cetirizine HCl (ZYRTEC ALLERGY) 10 MG CAPS Take 1 capsule (10 mg total) by mouth daily. 90 capsule 3  . Cholecalciferol (VITAMIN D) 2000 units CAPS Take 1 capsule daily by mouth.    Marland Kitchen GLUCAGON EMERGENCY 1 MG injection     . Insulin Human (INSULIN PUMP) SOLN Inject into the skin as directed. Humalog insulin pump    . insulin lispro (HUMALOG) 100 UNIT/ML injection     . vitamin B-12 (CYANOCOBALAMIN) 50 MCG tablet Take 1,000 mcg by mouth daily.     . Cholecalciferol 50000 units capsule Take 1 capsule (50,000 Units total) by mouth once a week. 12 capsule 12   No current facility-administered medications for this visit.     Allergies-reviewed and updated No Known Allergies  Social History   Socioeconomic History  . Marital status: Married    Spouse name: None  . Number of children: None  . Years of education: None  . Highest education level: None  Social Needs  . Financial resource strain: None  . Food insecurity - worry: None  . Food insecurity - inability: None  . Transportation needs - medical: None  . Transportation needs - non-medical: None  Occupational History  .  None  Tobacco Use  . Smoking status: Never Smoker  . Smokeless tobacco: Never Used  Substance and Sexual Activity  . Alcohol use: No    Alcohol/week: 0.0 oz  . Drug use: No  . Sexual activity: None  Other Topics Concern  . None  Social History Narrative   Lives with wife in 2 story home.  Has 1 stepdaughter- not in the home.       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    Education: associates degree. [employer].       Hobbies: time with dogs ( 2 small Chihuahuas) older    Objective: BP 118/72 (BP Location: Left Arm, Patient Position: Sitting, Cuff Size: Large)   Pulse 67   Temp 98.3 F (36.8 C) (Oral)   Ht 5\' 6"  (1.676 m)   Wt 170 lb 9.6 oz (77.4 kg)   SpO2 98%   BMI 27.54 kg/m  Gen: NAD, resting  comfortably HEENT: Mucous membranes are moist. Oropharynx normal Neck: no thyromegaly CV: RRR no murmurs rubs or gallops Lungs: CTAB no crackles, wheeze, rhonchi Abdomen: soft/nontender/nondistended/normal bowel sounds. No rebound or guarding.  Ext: no edema Skin: warm, dry Neuro: grossly normal, moves all extremities, PERRLA  Diabetic Foot Exam - Simple   Simple Foot Form Diabetic Foot exam was performed with the following findings:  Yes 08/26/2017  5:05 PM  Visual Inspection No deformities, no ulcerations, no other skin breakdown bilaterally:  Yes Sensation Testing Intact to touch and monofilament testing bilaterally:  Yes Pulse Check Posterior Tibialis and Dorsalis pulse intact bilaterally:  Yes Comments    Assessment/Plan:  36 y.o. female presenting for annual physical.  Health Maintenance counseling: 1. Anticipatory guidance: Patient counseled regarding regular dental exams -q6 months, eye exams -getting records for yearly diabetic eye exam from [facility], wearing seatbelts.  2. Risk factor reduction:  Advised patient of need for regular exercise and diet rich and fruits and vegetables to reduce risk of heart attack and stroke. Exercise- 3-4x a week at gym recently started 6 months ago. Diet-minimal eating out in week, eats out on weekend- she really feels its exercise in her case- feels like has gained muscle mass so weight increase not likely all abdominal obesity.  Wt Readings from Last 3 Encounters:  08/26/17 170 lb 9.6 oz (77.4 kg)  11/01/16 167 lb 6.4 oz (75.9 kg)  10/05/16 165 lb (74.8 kg)  3. Immunizations/screenings/ancillary studies Immunization History  Administered Date(s) Administered  . Influenza-Unspecified 08/07/2016, 07/26/2017  . Tdap 08/17/2016  4. Cervical cancer screening- Dr. Benjie Karvonen with last pap- will get records sept 2018- yearly pelvic. Will get last pap smear.  5. Breast cancer screening-  breast exam with GYN and mammogram - consider baseline next  year 6. Colon cancer screening - no family history, start at age 71-50 7. Skin cancer screening- Dr. Delman Cheadle yearly or so. advised regular sunscreen use. Denies worrisome, changing, or new skin lesions.  8. Birth control/STD check- homosexual relationship  Status of chronic or acute concerns   Low vitamin b12- continues daily b12 supplement, Lab Results  Component Value Date   VITAMINB12 853 08/17/2016   Vitamin D deficiency- on 2000 units a day  No dizzy episodes on zyrtec  Type 1 diabetes mellitus ([place]) TYpe 1 DM on pump- following with Dr. Buddy Duty. a1c better lately. Metformin caused hypoglycemia most recently when trying to use for weight management and irregular  cycles with pcos Lab Results  Component Value Date   HGBA1C 7.3 06/04/2017     PCOS (polycystic ovarian syndrome) PCOS- ovulates per Dr. Benjie Karvonen so no concern unopposed estrogen. Still irregular Got back on metformin but had low blood sugar so stopped taking- may revisit.    Future Appointments  Date Time Provider [place]  09/03/2017  8:15 AM [facility] LAB [facility] None   1 year CPE  Orders Placed This Encounter  Procedures  . CBC    Standing Status:   Future    Standing Expiration Date:   08/26/2018  . Comprehensive metabolic panel    [place]    Standing Status:   Future    Standing Expiration Date:   08/26/2018  . Lipid panel    Standing Status:   Future    Standing Expiration Date:   08/26/2018  . Vitamin B12    Standing Status:   Future    Standing Expiration Date:   08/26/2018  . VITAMIN D 25 Hydroxy (Vit-D Deficiency, Fractures)    Standing Status:   Future    Standing Expiration Date:   08/26/2018  . Microalbumin / creatinine urine ratio    [place]    Meds ordered this encounter  Medications  . Cholecalciferol (VITAMIN D) 2000 units CAPS    Sig: Take 1 capsule daily by mouth.    Return precautions advised.  Garret Reddish, MD

## 2017-08-26 NOTE — Assessment & Plan Note (Signed)
PCOS- ovulates per Dr. Benjie Karvonen so no concern unopposed estrogen. Still irregular Got back on metformin but had low blood sugar so stopped taking- may revisit.

## 2017-08-26 NOTE — Patient Instructions (Addendum)
Drop off urine before you leave  Schedule a lab visit at the check out desk within 2 weeks. Return for future fasting labs meaning nothing but water after midnight please. Ok to take your medications with water.   Great job getting into the gym! Proud of you- keep up your efforts

## 2017-08-26 NOTE — Assessment & Plan Note (Signed)
TYpe 1 DM on pump- following with Dr. Buddy Duty. a1c better lately. Metformin caused hypoglycemia most recently when trying to use for weight management and irregular cycles with pcos Lab Results  Component Value Date   HGBA1C 7.3 06/04/2017

## 2017-08-27 LAB — MICROALBUMIN / CREATININE URINE RATIO
Creatinine,U: 25.4 mg/dL
Microalb Creat Ratio: 2.8 mg/g (ref 0.0–30.0)
Microalb, Ur: <0.7 mg/dL (ref 0.0–1.9)

## 2017-08-29 ENCOUNTER — Encounter: Payer: Self-pay | Admitting: Family Medicine

## 2017-09-03 ENCOUNTER — Encounter: Payer: Self-pay | Admitting: Family Medicine

## 2017-09-03 ENCOUNTER — Other Ambulatory Visit (INDEPENDENT_AMBULATORY_CARE_PROVIDER_SITE_OTHER): Payer: 59

## 2017-09-03 DIAGNOSIS — E538 Deficiency of other specified B group vitamins: Secondary | ICD-10-CM

## 2017-09-03 DIAGNOSIS — Z0000 Encounter for general adult medical examination without abnormal findings: Secondary | ICD-10-CM | POA: Diagnosis not present

## 2017-09-03 DIAGNOSIS — E559 Vitamin D deficiency, unspecified: Secondary | ICD-10-CM

## 2017-09-03 DIAGNOSIS — E109 Type 1 diabetes mellitus without complications: Secondary | ICD-10-CM | POA: Diagnosis not present

## 2017-09-03 LAB — CBC
HCT: 42.3 % (ref 36.0–46.0)
Hemoglobin: 14 g/dL (ref 12.0–15.0)
MCHC: 33.2 g/dL (ref 30.0–36.0)
MCV: 87.5 fl (ref 78.0–100.0)
PLATELETS: 276 10*3/uL (ref 150.0–400.0)
RBC: 4.83 Mil/uL (ref 3.87–5.11)
RDW: 13.3 % (ref 11.5–15.5)
WBC: 5.5 10*3/uL (ref 4.0–10.5)

## 2017-09-03 LAB — LIPID PANEL
CHOL/HDL RATIO: 3
Cholesterol: 150 mg/dL (ref 0–200)
HDL: 52.3 mg/dL (ref >39.00)
LDL Cholesterol: 89 mg/dL (ref 0–99)
NONHDL: 97.27
TRIGLYCERIDES: 41 mg/dL (ref 0.0–149.0)
VLDL: 8.2 mg/dL (ref 0.0–40.0)

## 2017-09-03 LAB — COMPREHENSIVE METABOLIC PANEL
ALK PHOS: 68 U/L (ref 39–117)
ALT: 9 U/L (ref 0–35)
AST: 14 U/L (ref 0–37)
Albumin: 4 g/dL (ref 3.5–5.2)
BILIRUBIN TOTAL: 0.6 mg/dL (ref 0.2–1.2)
BUN: 14 mg/dL (ref 6–23)
CO2: 30 meq/L (ref 19–32)
CREATININE: 0.75 mg/dL (ref 0.40–1.20)
Calcium: 9.3 mg/dL (ref 8.4–10.5)
Chloride: 105 mEq/L (ref 96–112)
GFR: 93.75 mL/min (ref >60.00)
GLUCOSE: 170 mg/dL — AB (ref 70–99)
Potassium: 4.3 mEq/L (ref 3.5–5.1)
SODIUM: 141 meq/L (ref 135–145)
Total Protein: 6.9 g/dL (ref 6.0–8.3)

## 2017-09-03 LAB — VITAMIN B12: Vitamin B-12: 1150 pg/mL — ABNORMAL HIGH (ref 211–911)

## 2017-09-03 LAB — VITAMIN D 25 HYDROXY (VIT D DEFICIENCY, FRACTURES): VITD: 30.9 ng/mL (ref 30.00–100.00)

## 2017-09-04 DIAGNOSIS — E109 Type 1 diabetes mellitus without complications: Secondary | ICD-10-CM | POA: Diagnosis not present

## 2017-09-19 DIAGNOSIS — D225 Melanocytic nevi of trunk: Secondary | ICD-10-CM | POA: Diagnosis not present

## 2017-09-19 DIAGNOSIS — D2262 Melanocytic nevi of left upper limb, including shoulder: Secondary | ICD-10-CM | POA: Diagnosis not present

## 2017-09-19 DIAGNOSIS — D224 Melanocytic nevi of scalp and neck: Secondary | ICD-10-CM | POA: Diagnosis not present

## 2017-10-01 DIAGNOSIS — R05 Cough: Secondary | ICD-10-CM | POA: Diagnosis not present

## 2017-10-02 ENCOUNTER — Encounter: Payer: Self-pay | Admitting: Family Medicine

## 2017-10-03 NOTE — Telephone Encounter (Signed)
Copied from [place] #[id number]. Topic: Inquiry >> Oct 03, 2017  2:52 PM Oliver Pila B wrote: Reason for CRM: pt called to return a message for Dr. Yong Channel, she was told to contact the practice after 1 if she hasnt heard anything yet but she is trying to get something called in (Rx) for her cough, contact pt to advise

## 2018-01-28 DIAGNOSIS — E109 Type 1 diabetes mellitus without complications: Secondary | ICD-10-CM | POA: Diagnosis not present

## 2018-01-28 DIAGNOSIS — E559 Vitamin D deficiency, unspecified: Secondary | ICD-10-CM | POA: Diagnosis not present

## 2018-01-28 LAB — HEMOGLOBIN A1C: Hemoglobin A1C: 7.4

## 2018-01-28 LAB — VITAMIN D 25 HYDROXY (VIT D DEFICIENCY, FRACTURES): VIT D 25 HYDROXY: 32.6

## 2018-01-28 LAB — TSH: TSH: 1.13 (ref 0.41–5.90)

## 2018-01-28 LAB — BASIC METABOLIC PANEL: GLUCOSE: 166

## 2018-01-29 ENCOUNTER — Encounter: Payer: Self-pay | Admitting: Family Medicine

## 2018-02-10 DIAGNOSIS — E1065 Type 1 diabetes mellitus with hyperglycemia: Secondary | ICD-10-CM | POA: Diagnosis not present

## 2018-02-10 DIAGNOSIS — E109 Type 1 diabetes mellitus without complications: Secondary | ICD-10-CM | POA: Diagnosis not present

## 2018-02-19 ENCOUNTER — Ambulatory Visit: Payer: 59 | Admitting: *Deleted

## 2018-03-16 DIAGNOSIS — E119 Type 2 diabetes mellitus without complications: Secondary | ICD-10-CM | POA: Diagnosis not present

## 2018-03-21 LAB — HM DIABETES EYE EXAM

## 2018-05-08 DIAGNOSIS — E1065 Type 1 diabetes mellitus with hyperglycemia: Secondary | ICD-10-CM | POA: Diagnosis not present

## 2018-05-09 DIAGNOSIS — E109 Type 1 diabetes mellitus without complications: Secondary | ICD-10-CM | POA: Diagnosis not present

## 2018-06-14 ENCOUNTER — Other Ambulatory Visit: Payer: Self-pay | Admitting: Family Medicine

## 2018-08-06 DIAGNOSIS — Z5181 Encounter for therapeutic drug level monitoring: Secondary | ICD-10-CM | POA: Diagnosis not present

## 2018-08-06 DIAGNOSIS — E559 Vitamin D deficiency, unspecified: Secondary | ICD-10-CM | POA: Diagnosis not present

## 2018-08-06 DIAGNOSIS — E109 Type 1 diabetes mellitus without complications: Secondary | ICD-10-CM | POA: Diagnosis not present

## 2018-08-06 DIAGNOSIS — E282 Polycystic ovarian syndrome: Secondary | ICD-10-CM | POA: Diagnosis not present

## 2018-08-06 DIAGNOSIS — Z794 Long term (current) use of insulin: Secondary | ICD-10-CM | POA: Diagnosis not present

## 2018-08-06 LAB — HEMOGLOBIN A1C: Hemoglobin A1C: 7.4

## 2018-08-08 ENCOUNTER — Encounter: Payer: Self-pay | Admitting: Family Medicine

## 2018-08-11 DIAGNOSIS — Z6827 Body mass index (BMI) 27.0-27.9, adult: Secondary | ICD-10-CM | POA: Diagnosis not present

## 2018-08-11 DIAGNOSIS — Z01419 Encounter for gynecological examination (general) (routine) without abnormal findings: Secondary | ICD-10-CM | POA: Diagnosis not present

## 2018-08-13 DIAGNOSIS — E109 Type 1 diabetes mellitus without complications: Secondary | ICD-10-CM | POA: Diagnosis not present

## 2018-09-04 ENCOUNTER — Encounter: Payer: Self-pay | Admitting: Family Medicine

## 2018-09-04 ENCOUNTER — Ambulatory Visit (INDEPENDENT_AMBULATORY_CARE_PROVIDER_SITE_OTHER): Payer: 59 | Admitting: Family Medicine

## 2018-09-04 VITALS — BP 112/78 | HR 75 | Temp 98.5°F | Ht 66.0 in | Wt 171.4 lb

## 2018-09-04 DIAGNOSIS — E538 Deficiency of other specified B group vitamins: Secondary | ICD-10-CM

## 2018-09-04 DIAGNOSIS — E559 Vitamin D deficiency, unspecified: Secondary | ICD-10-CM

## 2018-09-04 DIAGNOSIS — E109 Type 1 diabetes mellitus without complications: Secondary | ICD-10-CM | POA: Diagnosis not present

## 2018-09-04 DIAGNOSIS — Z0000 Encounter for general adult medical examination without abnormal findings: Secondary | ICD-10-CM | POA: Diagnosis not present

## 2018-09-04 LAB — CBC WITH DIFFERENTIAL/PLATELET
BASOS ABS: 0 10*3/uL (ref 0.0–0.1)
BASOS PCT: 0.3 % (ref 0.0–3.0)
Eosinophils Absolute: 0.1 10*3/uL (ref 0.0–0.7)
Eosinophils Relative: 0.7 % (ref 0.0–5.0)
HEMATOCRIT: 40.3 % (ref 36.0–46.0)
HEMOGLOBIN: 13.5 g/dL (ref 12.0–15.0)
LYMPHS ABS: 2.6 10*3/uL (ref 0.7–4.0)
Lymphocytes Relative: 27.7 % (ref 12.0–46.0)
MCHC: 33.6 g/dL (ref 30.0–36.0)
MCV: 85.6 fl (ref 78.0–100.0)
Monocytes Absolute: 0.6 10*3/uL (ref 0.1–1.0)
Monocytes Relative: 6 % (ref 3.0–12.0)
Neutro Abs: 6.1 10*3/uL (ref 1.4–7.7)
Neutrophils Relative %: 65.3 % (ref 43.0–77.0)
Platelets: 290 10*3/uL (ref 150.0–400.0)
RBC: 4.7 Mil/uL (ref 3.87–5.11)
RDW: 13.3 % (ref 11.5–15.5)
WBC: 9.4 10*3/uL (ref 4.0–10.5)

## 2018-09-04 LAB — COMPREHENSIVE METABOLIC PANEL
ALBUMIN: 3.9 g/dL (ref 3.5–5.2)
ALK PHOS: 70 U/L (ref 39–117)
ALT: 12 U/L (ref 0–35)
AST: 28 U/L (ref 0–37)
BILIRUBIN TOTAL: 0.3 mg/dL (ref 0.2–1.2)
BUN: 17 mg/dL (ref 6–23)
CALCIUM: 8.8 mg/dL (ref 8.4–10.5)
CO2: 29 mEq/L (ref 19–32)
Chloride: 105 mEq/L (ref 96–112)
Creatinine, Ser: 0.72 mg/dL (ref 0.40–1.20)
GFR: 97.7 mL/min (ref >60.00)
Glucose, Bld: 165 mg/dL — ABNORMAL HIGH (ref 70–99)
Potassium: 4.1 mEq/L (ref 3.5–5.1)
Sodium: 140 mEq/L (ref 135–145)
TOTAL PROTEIN: 6.7 g/dL (ref 6.0–8.3)

## 2018-09-04 LAB — VITAMIN B12

## 2018-09-04 LAB — LIPID PANEL
CHOLESTEROL: 144 mg/dL (ref 0–200)
HDL: 51.1 mg/dL (ref >39.00)
LDL Cholesterol: 83 mg/dL (ref 0–99)
NONHDL: 92.41
Total CHOL/HDL Ratio: 3
Triglycerides: 49 mg/dL (ref 0.0–149.0)
VLDL: 9.8 mg/dL (ref 0.0–40.0)

## 2018-09-04 LAB — MICROALBUMIN / CREATININE URINE RATIO
Creatinine,U: 74.2 mg/dL
Microalb Creat Ratio: 0.9 mg/g (ref 0.0–30.0)

## 2018-09-04 LAB — VITAMIN D 25 HYDROXY (VIT D DEFICIENCY, FRACTURES): VITD: 24.45 ng/mL — ABNORMAL LOW (ref 30.00–100.00)

## 2018-09-04 NOTE — Assessment & Plan Note (Signed)
follows with Dr[facility]- we are going to try to get her most recent A1c through release of information. Insulin pump. Lipids have looked great- we discussed if she wanted to be aggressive we could consider statin-otherwise consider rosuvastatin 10 to 20 mg or atorvastatin 20 mg once weekly once she gets age 37.

## 2018-09-04 NOTE — Patient Instructions (Addendum)
Health Maintenance Due  Topic Date Due  . PNEUMOCOCCAL POLYSACCHARIDE VACCINE AGE 35-64 HIGH RISK-offered today and declined  03/11/1985  . PAP SMEAR-we will get records 03/11/2004  . HEMOGLOBIN A1C-we will get records from Dr. Buddy Duty- last one from April in our chart 07/30/2018  . FOOT EXAM-updated today  08/26/2018  . URINE MICROALBUMIN-with labs today 08/26/2018   Please stop by lab before you go

## 2018-09-04 NOTE — Progress Notes (Signed)
Phone: [phone]  Subjective:  Patient presents today for their annual physical. Chief complaint-noted.   See problem oriented charting- ROS- full  review of systems was completed and negative except for: cough, congestion, sinus pressure  The following were reviewed and entered/updated in epic: Past Medical History:  Diagnosis Date  . Diabetes mellitus without complication (HCC)    Dr. Buddy Duty with [facility]. On insulin pump. Dexcom sensor that talks to the pump  . PCOS (polycystic ovarian syndrome)    metformin and actos in the past- didnt help that much. shaves daily.   . Seasonal allergies    zyrtec   Patient Active Problem List   Diagnosis Date Noted  . Type 1 diabetes mellitus ([place]) 01/20/2016    Priority: High  . PCOS (polycystic ovarian syndrome)     Priority: Medium  . Low vitamin B12 level 01/20/2016    Priority: Medium  . Vitamin D deficiency 08/18/2016    Priority: Low  . Seasonal allergies     Priority: Low  . Long term current use of insulin ([place]) 10/25/2015    Priority: Low  . Atypical chest pain 08/18/2016   Past Surgical History:  Procedure Laterality Date  . OVARIAN CYST REMOVAL  2013    Family History  Problem Relation Age of Onset  . Heart disease Paternal Grandfather 10       heart disease related  . Prostate cancer Paternal Grandfather   . Healthy Mother   . Hyperlipidemia Father        monitoring at present  . COPD Maternal Grandmother        never smoker- worked in [employer]  . Arthritis Maternal Grandmother   . Cancer Maternal Grandfather        prostate cancer & died at age 60  . Ovarian cancer Paternal Grandmother   . Lung cancer Maternal Uncle        smoker  . CAD Maternal Uncle        massive MI under age 23  . Colon cancer Neg Hx   . Pancreatic cancer Neg Hx   . Stomach cancer Neg Hx   . Esophageal cancer Neg Hx     Medications- reviewed and updated Current Outpatient Medications  Medication Sig Dispense Refill    . Cholecalciferol (VITAMIN D) 2000 units CAPS Take 1 capsule daily by mouth.    . CONTOUR NEXT TEST test strip     . EQ ALLERGY RELIEF, CETIRIZINE, 10 MG tablet TAKE 1 TABLET BY MOUTH ONCE DAILY 90 tablet 3  . GLUCAGON EMERGENCY 1 MG injection     . Insulin Human (INSULIN PUMP) SOLN Inject into the skin as directed. Humalog insulin pump    . insulin lispro (HUMALOG) 100 UNIT/ML injection     . vitamin B-12 (CYANOCOBALAMIN) 50 MCG tablet Take 1,000 mcg by mouth daily.      No current facility-administered medications for this visit.     Allergies-reviewed and updated No Known Allergies  Social History   Social History Narrative   Lives with wife in 2 story home.  Has 1 stepdaughter- not in the home.       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    Education: associates degree. [employer].       Hobbies: time with dogs ( 2 small Chihuahuas) older    Objective: BP 112/78 (BP Location: Right Arm, Patient Position: Sitting, Cuff Size:  Normal)   Pulse 75   Temp 98.5 F (36.9 C) (Oral)   Ht 5\' 6"  (1.676 m)   Wt 171 lb 6.1 oz (77.7 kg)   LMP 08/06/2018   SpO2 97%   BMI 27.66 kg/m  Gen: NAD, resting comfortably HEENT: Mucous membranes are moist. Oropharynx normal Neck: no thyromegaly CV: RRR no murmurs rubs or gallops Lungs: CTAB no crackles, wheeze, rhonchi Abdomen: soft/nontender/nondistended/normal bowel sounds. No rebound or guarding.  Ext: no edema Skin: warm, dry Neuro: grossly normal, moves all extremities, PERRLA  Diabetic Foot Exam - Simple   Simple Foot Form Diabetic Foot exam was performed with the following findings:  Yes 09/04/2018  2:01 PM  Visual Inspection No deformities, no ulcerations, no other skin breakdown bilaterally:  Yes Sensation Testing Intact to touch and monofilament testing bilaterally:  Yes Pulse Check Posterior Tibialis and Dorsalis pulse intact  bilaterally:  Yes Comments    Assessment/Plan:  37 y.o. female presenting for annual physical.  Health Maintenance counseling: 1. Anticipatory guidance: Patient counseled regarding regular dental exams -q6 months, eye exams -yearly due to diabetes ,  avoiding smoking and second hand smoke, limiting alcohol to 1 beverage per day- she drinks none.   2. Risk factor reduction:  Advised patient of need for regular exercise and diet rich and fruits and vegetables to reduce risk of heart attack and stroke. Exercise- at least doing 2 days a week- trying to get to 4 (full body workout 2 of days at gym, other 2 days cardio). Diet-feels doing well in week- cheat days Saturday and sundays- weight largely stable- she thinks weight is going up slightly due to muscle gain, she has noted improvements in how clothes fit for example.  Wt Readings from Last 3 Encounters:  09/04/18 171 lb 6.1 oz (77.7 kg)  08/26/17 170 lb 9.6 oz (77.4 kg)  11/01/16 167 lb 6.4 oz (75.9 kg)  3. Immunizations/screenings/ancillary studies Immunization History  Administered Date(s) Administered  . Influenza-Unspecified 08/07/2016, 07/26/2017, 07/26/2018  . Tdap 08/17/2016   Health Maintenance Due  Topic Date Due  . PNEUMOCOCCAL POLYSACCHARIDE VACCINE AGE 56-64 HIGH RISK-offered today and declined  03/11/1985  . PAP SMEAR-we will get records 03/11/2004  . HEMOGLOBIN A1C-we will get records from Dr. Buddy Duty- last one from April in our chart 07/30/2018  . FOOT EXAM-updated today  08/26/2018  . URINE MICROALBUMIN-with labs today 08/26/2018   4. Cervical cancer screening- getting records from gynecology-sees Dr. Benjie Karvonen 5. Breast cancer screening-  breast exam with gynecology and mammogram-starting age 18  6. Colon cancer screening - no family history, start at age 79-50 7. Skin cancer screening-sees Dr. Delman Cheadle yearly.advised regular sunscreen use. Denies worrisome, changing, or new skin lesions.  8. Birth control/STD check- monogamous  homosexual relationship  9. never smoker  Status of chronic or acute concerns  8 days of illness- stared sore throat, now mainly congested, some cough- thick yellow substance. Has not been febrile. Does have some sinus pressure. Getting better. shes trying flonase and vitamin C If she fails to continue to improve through next week and has continued sinus pressure I told her I would send in augmentin for her  Low vitamin B12-continues daily B12 supplement.  Will get B12 level  Vitamin D deficiency-on 2000 units daily.  Update vitamin D levels  PCOS- apparently gynecology think she does ovulate so not concerned about unopposed estrogen.  She has irregular periods.  Was not able to tolerate metformin due to hypoglycemia. Had bad headaches on OCP.  Type 1 diabetes mellitus (HCC) [facility]- we are going to try to get her most recent A1c through release of information. Insulin pump. Lipids have looked great- we discussed if she wanted to be aggressive we could consider statin-otherwise consider rosuvastatin 10 to 20 mg or atorvastatin 20 mg once weekly once she gets age 77.  1 year physical unless needs [place] sooner  Lab/Order associations: NOT fasting Preventative health care  Type 1 diabetes mellitus without complication ([place]) - Plan: CBC with Differential/Platelet, Comprehensive metabolic panel, Lipid panel, Microalbumin / creatinine urine ratio  Low vitamin B12 level - Plan: Vitamin B12  Vitamin D deficiency - Plan: VITAMIN D 25 Hydroxy (Vit-D Deficiency, Fractures)  Return precautions advised.  Garret Reddish, MD

## 2018-09-16 ENCOUNTER — Encounter: Payer: Self-pay | Admitting: Family Medicine

## 2018-10-02 DIAGNOSIS — E1065 Type 1 diabetes mellitus with hyperglycemia: Secondary | ICD-10-CM | POA: Diagnosis not present

## 2018-12-31 DIAGNOSIS — E1065 Type 1 diabetes mellitus with hyperglycemia: Secondary | ICD-10-CM | POA: Diagnosis not present

## 2019-02-17 ENCOUNTER — Telehealth: Payer: Self-pay

## 2019-02-17 NOTE — Telephone Encounter (Signed)
-----   Message from Marin Olp, MD sent at 02/16/2019  9:16 PM EDT ----- Convert to phone note. There is a ROI for [facility] from November- lets resend that- need a copy of her most recent a1c please. Last one on file is from April 2019.   Thanks, Garret Reddish

## 2019-02-17 NOTE — Telephone Encounter (Signed)
FYI [employer] and was told pt last A1c was 7.4 in 07/2018. Information will be faxed ASAP

## 2019-04-02 ENCOUNTER — Encounter: Payer: Self-pay | Admitting: Family Medicine

## 2019-04-14 ENCOUNTER — Encounter: Payer: Self-pay | Admitting: Family Medicine

## 2019-05-22 LAB — HM DIABETES EYE EXAM

## 2019-06-27 ENCOUNTER — Other Ambulatory Visit: Payer: Self-pay | Admitting: Family Medicine

## 2019-06-30 NOTE — Telephone Encounter (Signed)
Patient need to schedule an ov for more refills. 

## 2019-07-01 MED ORDER — CETIRIZINE HCL 10 MG PO TABS: 10.0000 mg | ORAL_TABLET | Freq: Every day | ORAL | 0 refills | Status: DC

## 2019-07-01 NOTE — Telephone Encounter (Signed)
See note

## 2019-07-01 NOTE — Addendum Note (Signed)
Addended by: Jasper Loser on: 07/01/2019 05:19 PM   Modules accepted: Orders

## 2019-07-01 NOTE — Telephone Encounter (Signed)
Pt is requesting 3 month supply, she scheduled yearly CPE with me but wants to know if refill can be sent to last her until her scheduled CPE on 09/07/2019. Please advise

## 2019-08-13 ENCOUNTER — Ambulatory Visit: Payer: 59 | Admitting: Family Medicine

## 2019-08-14 ENCOUNTER — Other Ambulatory Visit: Payer: Self-pay

## 2019-08-14 DIAGNOSIS — Z20822 Contact with and (suspected) exposure to covid-19: Secondary | ICD-10-CM

## 2019-08-16 LAB — NOVEL CORONAVIRUS, NAA: SARS-CoV-2, NAA: NOT DETECTED

## 2019-09-07 ENCOUNTER — Other Ambulatory Visit: Payer: Self-pay

## 2019-09-07 ENCOUNTER — Encounter: Payer: Self-pay | Admitting: Family Medicine

## 2019-09-07 ENCOUNTER — Ambulatory Visit (INDEPENDENT_AMBULATORY_CARE_PROVIDER_SITE_OTHER): Payer: 59 | Admitting: Family Medicine

## 2019-09-07 VITALS — BP 104/70 | HR 68 | Temp 98.2°F | Ht 66.0 in | Wt 167.2 lb

## 2019-09-07 DIAGNOSIS — E559 Vitamin D deficiency, unspecified: Secondary | ICD-10-CM

## 2019-09-07 DIAGNOSIS — Z0000 Encounter for general adult medical examination without abnormal findings: Secondary | ICD-10-CM

## 2019-09-07 DIAGNOSIS — E109 Type 1 diabetes mellitus without complications: Secondary | ICD-10-CM

## 2019-09-07 DIAGNOSIS — E538 Deficiency of other specified B group vitamins: Secondary | ICD-10-CM | POA: Diagnosis not present

## 2019-09-07 DIAGNOSIS — E282 Polycystic ovarian syndrome: Secondary | ICD-10-CM

## 2019-09-07 LAB — POCT GLYCOSYLATED HEMOGLOBIN (HGB A1C): Hemoglobin A1C: 6.9 % — AB (ref 4.0–5.6)

## 2019-09-07 NOTE — Progress Notes (Signed)
Phone: [phone]   Subjective:  Patient presents today for their annual physical. Chief complaint-noted.   See problem oriented charting- ROS- full  review of systems was completed and negative except for: seasonal allergies  The following were reviewed and entered/updated in epic: Past Medical History:  Diagnosis Date  . Diabetes mellitus without complication (HCC)    Dr. Buddy Duty with [facility]. On insulin pump. Dexcom sensor that talks to the pump  . PCOS (polycystic ovarian syndrome)    metformin and actos in the past- didnt help that much. shaves daily.   . Seasonal allergies    zyrtec   Patient Active Problem List   Diagnosis Date Noted  . Type 1 diabetes mellitus ([place]) 01/20/2016    Priority: High  . PCOS (polycystic ovarian syndrome)     Priority: Medium  . Low vitamin B12 level 01/20/2016    Priority: Medium  . Vitamin D deficiency 08/18/2016    Priority: Low  . Seasonal allergies     Priority: Low  . Long term current use of insulin ([place]) 10/25/2015    Priority: Low  . Atypical chest pain 08/18/2016   Past Surgical History:  Procedure Laterality Date  . OVARIAN CYST REMOVAL  2013    Family History  Problem Relation Age of Onset  . Heart disease Paternal Grandfather 1       heart disease related  . Prostate cancer Paternal Grandfather   . Healthy Mother   . Hyperlipidemia Father        monitoring at present  . COPD Maternal Grandmother        never smoker- worked in [employer]  . Arthritis Maternal Grandmother   . Cancer Maternal Grandfather        prostate cancer & died at age 9  . Ovarian cancer Paternal Grandmother   . Lung cancer Maternal Uncle        smoker  . CAD Maternal Uncle        massive MI under age 39  . Colon cancer Neg Hx   . Pancreatic cancer Neg Hx   . Stomach cancer Neg Hx   . Esophageal cancer Neg Hx     Medications- reviewed and updated Current Outpatient Medications  Medication Sig Dispense Refill  . cetirizine  (ZYRTEC) 10 MG tablet Take 1 tablet (10 mg total) by mouth daily. 90 tablet 0  . Cholecalciferol (VITAMIN D) 2000 units CAPS Take 1 capsule daily by mouth.    . CONTOUR NEXT TEST test strip     . GLUCAGON EMERGENCY 1 MG injection     . Insulin Human (INSULIN PUMP) SOLN Inject into the skin as directed. Humalog insulin pump    . insulin lispro (HUMALOG) 100 UNIT/ML injection     . vitamin B-12 (CYANOCOBALAMIN) 50 MCG tablet Take 500 mcg by mouth daily.      No current facility-administered medications for this visit.     Allergies-reviewed and updated No Known Allergies  Social History   Social History Narrative   Lives with wife in 2 story home.  Has 1 stepdaughter- not in the home.       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    Education: associates degree. [employer].       Hobbies: time with dogs ( 2 small Chihuahuas) older   Objective  Objective:  BP 104/70   Pulse 68   Temp 98.2 F (  36.8 C)   Ht 5\' 6"  (1.676 m)   Wt 167 lb 3.2 oz (75.8 kg)   SpO2 98%   BMI 26.99 kg/m  Gen: NAD, resting comfortably HEENT: Mask not removed due to covid 19. TM normal. Bridge of nose normal. Eyelids normal.  Neck: no thyromegaly or cervical lymphadenopathy  CV: RRR no murmurs rubs or gallops Lungs: CTAB no crackles, wheeze, rhonchi Abdomen: soft/nontender/nondistended/normal bowel sounds. No rebound or guarding.  Ext: no edema Skin: warm, dry Neuro: grossly normal, moves all extremities, PERRLA  Diabetic Foot Exam - Simple   Simple Foot Form Diabetic Foot exam was performed with the following findings: Yes 09/07/2019  4:36 PM  Visual Inspection No deformities, no ulcerations, no other skin breakdown bilaterally: Yes Sensation Testing Intact to touch and monofilament testing bilaterally: Yes Pulse Check Posterior Tibialis and Dorsalis pulse intact bilaterally: Yes Comments        Assessment and Plan   38 y.o. female presenting for annual physical.  Health Maintenance counseling: 1. Anticipatory guidance: Patient counseled regarding regular dental exams yes q6 months, eye exams- yearly,  avoiding smoking and second hand smoke- yes , limiting alcohol to 0 beverage per day (does not drink) .   2. Risk factor reduction:  Advised patient of need for regular exercise and diet rich and fruits and vegetables to reduce risk of heart attack and stroke. Exercise- 3-5 times a week walks a mile and treadmill- set up a weight machine that she is going to try to work on. Diet- cooks at home and eats out on weekends. Trying to eat reasonably healthy diet.  Wt Readings from Last 3 Encounters:  09/07/19 167 lb 3.2 oz (75.8 kg)  09/04/18 171 lb 6.1 oz (77.7 kg)  08/26/17 170 lb 9.6 oz (77.4 kg)  3. Immunizations/screenings/ancillary studies- up to date other than option of early pneumovax- she wants to hold off for now.  Immunization History  Administered Date(s) Administered  . Influenza,inj,Quad PF,6+ Mos 07/17/2019  . Influenza-Unspecified 08/07/2016, 07/26/2017, 07/26/2018  . Tdap 08/17/2016  4. Cervical cancer screening-  06/18/16 WNL pap with HPV negative. Discussed with GYN and since she was on cycle last week they opted to push to next year and she will still be within guidelines due to prior negative HPV 5. Breast cancer screening-  breast exam yes and mammogram does not get yet- she is opting to start at age 55.  24. Colon cancer screening - not of age, will plan on at age 33.  34. Skin cancer screening- every 2 year derm visits. advised regular sunscreen use. Denies worrisome, changing, or new skin lesions.  8. Birth control/STD check- monogomous with female spouse 68. Osteoporosis screening at 8- may screen early if she discovers family history- possibly grandma -never  smoker Status of chronic or acute concerns   Type 1 diabetes mellitus without complication ([place]) - Plan:  POCT HgB A1C Lab Results  Component Value Date   HGBA1C 6.9 (A) 09/07/2019  - follows with Dr. Buddy Duty- forward labs to him. Uses insulin pump and sensor- Guardian 3.   PCOS (polycystic ovarian syndrome)- untreated. Still ovulates/gets regular cycles  Low vitamin B12 level- takes b12 daily. Update b12 with labs  Vitamin D deficiency- takes vitamin D 5k units per day- update with labs  Hyperlipidemia- from avs "New LDL goal 70 or less. You were at 78 last year. Lets check lipid panel and if above 70 can either try to buckle down on diet/exercise or can consider rosuvastatin  10mg  once a week or atorvastatin 20mg  is another common one I use- being more aggressive due to type I diabetes. "  Recommended follow up: 1 year physical  Lab/Order associations: will come back potentially fasting if early AM, if at lunch then non fasting planned   ICD-10-CM   1. Type 1 diabetes mellitus without complication (HCC)  [id number] POCT HgB A1C  2. Preventative health care  Z00.00   3. PCOS (polycystic ovarian syndrome)  E28.2   4. Low vitamin B12 level  E53.8   5. Vitamin D deficiency  E55.9     No orders of the defined types were placed in this encounter.   Return precautions advised.  Garret Reddish, MD

## 2019-09-07 NOTE — Patient Instructions (Addendum)
Health Maintenance Due  Topic Date Due  . PNEUMOCOCCAL POLYSACCHARIDE VACCINE AGE 36-64 HIGH RISK -wants to wait 03/11/1985  . HEMOGLOBIN A1C -today Lab Results  Component Value Date   HGBA1C 6.9 (A) 09/07/2019   02/05/2019  . OPHTHALMOLOGY EXAM - 04/2019 request record 03/22/2019  . INFLUENZA VACCINE -06/2019 05/23/2019  . PAP SMEAR- 2021 06/19/2019  . FOOT EXAM - normal today 09/05/2019  . URINE MICROALBUMIN  09/05/2019   New LDL goal 70 or less. You were at 32 last year. Lets check lipid panel and if above 70 can either try to buckle down on diet/exercise or can consider rosuvastatin 10mg  once a week or atorvastatin 20mg  is another common one I use- being more aggressive due to type I diabetes.

## 2019-09-09 ENCOUNTER — Other Ambulatory Visit: Payer: Self-pay

## 2019-09-10 ENCOUNTER — Other Ambulatory Visit (INDEPENDENT_AMBULATORY_CARE_PROVIDER_SITE_OTHER): Payer: 59

## 2019-09-10 DIAGNOSIS — Z0000 Encounter for general adult medical examination without abnormal findings: Secondary | ICD-10-CM | POA: Diagnosis not present

## 2019-09-10 DIAGNOSIS — E559 Vitamin D deficiency, unspecified: Secondary | ICD-10-CM | POA: Diagnosis not present

## 2019-09-10 DIAGNOSIS — E538 Deficiency of other specified B group vitamins: Secondary | ICD-10-CM | POA: Diagnosis not present

## 2019-09-10 DIAGNOSIS — E109 Type 1 diabetes mellitus without complications: Secondary | ICD-10-CM

## 2019-09-10 LAB — CBC WITH DIFFERENTIAL/PLATELET
Basophils Absolute: 0 10*3/uL (ref 0.0–0.1)
Basophils Relative: 0.4 % (ref 0.0–3.0)
Eosinophils Absolute: 0.1 10*3/uL (ref 0.0–0.7)
Eosinophils Relative: 1 % (ref 0.0–5.0)
HCT: 42.7 % (ref 36.0–46.0)
Hemoglobin: 14.3 g/dL (ref 12.0–15.0)
Lymphocytes Relative: 38 % (ref 12.0–46.0)
Lymphs Abs: 2.3 10*3/uL (ref 0.7–4.0)
MCHC: 33.4 g/dL (ref 30.0–36.0)
MCV: 87.1 fl (ref 78.0–100.0)
Monocytes Absolute: 0.5 10*3/uL (ref 0.1–1.0)
Monocytes Relative: 7.4 % (ref 3.0–12.0)
Neutro Abs: 3.3 10*3/uL (ref 1.4–7.7)
Neutrophils Relative %: 53.2 % (ref 43.0–77.0)
Platelets: 280 10*3/uL (ref 150.0–400.0)
RBC: 4.9 Mil/uL (ref 3.87–5.11)
RDW: 12.6 % (ref 11.5–15.5)
WBC: 6.2 10*3/uL (ref 4.0–10.5)

## 2019-09-10 LAB — COMPREHENSIVE METABOLIC PANEL
ALT: 11 U/L (ref 0–35)
AST: 13 U/L (ref 0–37)
Albumin: 4.1 g/dL (ref 3.5–5.2)
Alkaline Phosphatase: 77 U/L (ref 39–117)
BUN: 22 mg/dL (ref 6–23)
CO2: 27 mEq/L (ref 19–32)
Calcium: 9.2 mg/dL (ref 8.4–10.5)
Chloride: 105 mEq/L (ref 96–112)
Creatinine, Ser: 0.76 mg/dL (ref 0.40–1.20)
GFR: 85.87 mL/min (ref >60.00)
Glucose, Bld: 132 mg/dL — ABNORMAL HIGH (ref 70–99)
Potassium: 4.4 mEq/L (ref 3.5–5.1)
Sodium: 140 mEq/L (ref 135–145)
Total Bilirubin: 0.4 mg/dL (ref 0.2–1.2)
Total Protein: 7 g/dL (ref 6.0–8.3)

## 2019-09-10 LAB — LIPID PANEL
Cholesterol: 166 mg/dL (ref 0–200)
HDL: 54.6 mg/dL (ref >39.00)
LDL Cholesterol: 101 mg/dL — ABNORMAL HIGH (ref 0–99)
NonHDL: 111.55
Total CHOL/HDL Ratio: 3
Triglycerides: 54 mg/dL (ref 0.0–149.0)
VLDL: 10.8 mg/dL (ref 0.0–40.0)

## 2019-09-10 LAB — VITAMIN B12: Vitamin B-12: 1094 pg/mL — ABNORMAL HIGH (ref 211–911)

## 2019-09-10 LAB — VITAMIN D 25 HYDROXY (VIT D DEFICIENCY, FRACTURES): VITD: 48.58 ng/mL (ref 30.00–100.00)

## 2019-09-10 LAB — MICROALBUMIN / CREATININE URINE RATIO
Creatinine,U: 81.2 mg/dL
Microalb Creat Ratio: 0.9 mg/g (ref 0.0–30.0)
Microalb, Ur: <0.7 mg/dL (ref 0.0–1.9)

## 2019-09-11 ENCOUNTER — Other Ambulatory Visit: Payer: Self-pay

## 2019-09-11 DIAGNOSIS — Z20822 Contact with and (suspected) exposure to covid-19: Secondary | ICD-10-CM

## 2019-09-13 ENCOUNTER — Telehealth: Payer: Self-pay

## 2019-09-13 NOTE — Telephone Encounter (Signed)
Pt called for covid test results advised reults are not back.

## 2019-09-14 LAB — NOVEL CORONAVIRUS, NAA: SARS-CoV-2, NAA: NOT DETECTED

## 2019-10-05 ENCOUNTER — Encounter: Payer: Self-pay | Admitting: Family Medicine

## 2019-10-05 ENCOUNTER — Other Ambulatory Visit: Payer: Self-pay | Admitting: Family Medicine

## 2019-10-06 ENCOUNTER — Other Ambulatory Visit: Payer: Self-pay

## 2019-10-06 MED ORDER — ROSUVASTATIN CALCIUM 10 MG PO TABS: 10.0000 mg | ORAL_TABLET | ORAL | 3 refills | Status: DC

## 2019-10-12 ENCOUNTER — Encounter: Payer: Self-pay | Admitting: Family Medicine

## 2019-11-16 ENCOUNTER — Encounter: Payer: Self-pay | Admitting: Family Medicine

## 2019-12-29 ENCOUNTER — Other Ambulatory Visit: Payer: Self-pay | Admitting: Family Medicine

## 2020-04-07 ENCOUNTER — Other Ambulatory Visit: Payer: Self-pay | Admitting: Family Medicine

## 2020-04-29 ENCOUNTER — Other Ambulatory Visit: Payer: Self-pay

## 2020-04-29 MED ORDER — ROSUVASTATIN CALCIUM 10 MG PO TABS: 10.0000 mg | ORAL_TABLET | ORAL | 3 refills | Status: DC

## 2020-05-16 ENCOUNTER — Other Ambulatory Visit: Payer: 59

## 2020-07-08 ENCOUNTER — Other Ambulatory Visit: Payer: Self-pay | Admitting: Family Medicine

## 2020-08-22 ENCOUNTER — Encounter: Payer: Self-pay | Admitting: Family Medicine

## 2020-10-07 ENCOUNTER — Other Ambulatory Visit: Payer: Self-pay | Admitting: Family Medicine

## 2020-10-07 LAB — HM DIABETES EYE EXAM

## 2020-11-07 NOTE — Progress Notes (Deleted)
Phone [phone]   Subjective:  Patient presents today for their annual physical. Chief complaint-noted.   See problem oriented charting- ROS- full  review of systems was completed and negative except for: ***  The following were reviewed and entered/updated in epic: Past Medical History:  Diagnosis Date  . Diabetes mellitus without complication (HCC)    Dr. Buddy Duty with [facility]. On insulin pump. Dexcom sensor that talks to the pump  . PCOS (polycystic ovarian syndrome)    metformin and actos in the past- didnt help that much. shaves daily.   . Seasonal allergies    zyrtec   Patient Active Problem List   Diagnosis Date Noted  . Vitamin D deficiency 08/18/2016  . Atypical chest pain 08/18/2016  . PCOS (polycystic ovarian syndrome)   . Seasonal allergies   . Type 1 diabetes mellitus ([place]) 01/20/2016  . Low vitamin B12 level 01/20/2016  . Long term current use of insulin ([place]) 10/25/2015   Past Surgical History:  Procedure Laterality Date  . OVARIAN CYST REMOVAL  2013    Family History  Problem Relation Age of Onset  . Heart disease Paternal Grandfather 50       heart disease related  . Prostate cancer Paternal Grandfather   . Healthy Mother   . Hyperlipidemia Father        monitoring at present  . COPD Maternal Grandmother        never smoker- worked in [employer]  . Arthritis Maternal Grandmother   . Cancer Maternal Grandfather        prostate cancer & died at age 60  . Ovarian cancer Paternal Grandmother   . Lung cancer Maternal Uncle        smoker  . CAD Maternal Uncle        massive MI under age 17  . Colon cancer Neg Hx   . Pancreatic cancer Neg Hx   . Stomach cancer Neg Hx   . Esophageal cancer Neg Hx     Medications- reviewed and updated Current Outpatient Medications  Medication Sig Dispense Refill  . Cholecalciferol (VITAMIN D) 2000 units CAPS Take 1 capsule daily by mouth.    . CONTOUR NEXT TEST test strip     . EQ ALLERGY RELIEF,  CETIRIZINE, 10 MG tablet Take 1 tablet by mouth once daily 90 tablet 0  . GLUCAGON EMERGENCY 1 MG injection     . Insulin Human (INSULIN PUMP) SOLN Inject into the skin as directed. Humalog insulin pump    . insulin lispro (HUMALOG) 100 UNIT/ML injection     . rosuvastatin (CRESTOR) 10 MG tablet Take 1 tablet (10 mg total) by mouth once a week. 13 tablet 3  . vitamin B-12 (CYANOCOBALAMIN) 50 MCG tablet Take 500 mcg by mouth daily.      No current facility-administered medications for this visit.    Allergies-reviewed and updated No Known Allergies  Social History   Social History Narrative   Lives with wife in 2 story home.  Has 1 stepdaughter- not in the home.       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    Education: associates degree. [employer].       Hobbies: time with dogs ( 2 small Chihuahuas) older   Objective  Objective:  There were no vitals taken for this visit. Gen: NAD, resting comfortably HEENT: Mucous membranes are moist. Oropharynx normal Neck: no thyromegaly  CV: RRR no murmurs rubs or gallops Lungs: CTAB no crackles, wheeze, rhonchi Abdomen: soft/nontender/nondistended/normal bowel sounds. No rebound or guarding.  Ext: no edema Skin: warm, dry Neuro: grossly normal, moves all extremities, PERRLA***   Assessment and Plan   40 y.o. female presenting for annual physical.  Health Maintenance counseling: 1. Anticipatory guidance: Patient counseled regarding regular dental exams ***q6 months, eye exams ***,  avoiding smoking and second hand smoke*** , limiting alcohol to 1 beverage per day*** .   2. Risk factor reduction:  Advised patient of need for regular exercise and diet rich and fruits and vegetables to reduce risk of heart attack and stroke. Exercise- ***. Diet-***.  Wt Readings from Last 3 Encounters:  09/07/19 167 lb 3.2 oz (75.8 kg)  09/04/18 171 lb 6.1 oz (77.7  kg)  08/26/17 170 lb 9.6 oz (77.4 kg)   3. Immunizations/screenings/ancillary studies Immunization History  Administered Date(s) Administered  . Influenza,inj,Quad PF,6+ Mos 07/17/2019  . Influenza-Unspecified 08/07/2016, 07/26/2017, 07/26/2018  . Tdap 08/17/2016   Health Maintenance Due  Topic Date Due  . Hepatitis C Screening  Never done  . PNEUMOCOCCAL POLYSACCHARIDE VACCINE AGE 27-64 HIGH RISK  Never done  . COVID-19 Vaccine (1) Never done  . OPHTHALMOLOGY EXAM  03/22/2019  . HEMOGLOBIN A1C  03/06/2020  . FOOT EXAM  09/06/2020  . URINE MICROALBUMIN  09/09/2020   4. Cervical cancer screening- *** 5. Breast cancer screening-  breast exam *** and mammogram *** 6. Colon cancer screening - *** 7. Skin cancer screening- ***advised regular sunscreen use. Denies worrisome, changing, or new skin lesions.  8. Birth control/STD check- *** 9. Osteoporosis screening at 58- *** -*** smoker  Status of chronic or acute concerns  # Diabetes S: Medication: Humalog CBGs- *** Exercise and diet- *** Lab Results  Component Value Date   HGBA1C 6.9 (A) 09/07/2019   HGBA1C 7.4 08/06/2018   HGBA1C 7.4 01/28/2018    A/P: ***  #Vitamin D deficiency S: Medication: Vit D 2000u Last vitamin D Lab Results  Component Value Date   VD25OH 48.58 09/10/2019   A/P: ***   No specialty comments available. *** No diagnosis found.  Recommended follow up: ***No follow-ups on file. Future Appointments  Date Time Provider [place]  11/08/2020  1:40 PM Marin Olp, MD [facility]    No chief complaint on file.  Lab/Order associations:*** fasting No diagnosis found.  No orders of the defined types were placed in this encounter.   Return precautions advised.  Clyde Lundborg, CMA

## 2020-11-07 NOTE — Patient Instructions (Incomplete)
Please stop by lab before you go If you have mychart- we will send your results within 3 business days of [place] receiving them.  If you do not have mychart- we will call you about results within 5 business days of [place] receiving them.  *please also note that you will see labs on mychart as soon as they post. I will later go in and write notes on them- will say "notes from Dr. Yong Channel"  Health Maintenance Due  Topic Date Due  . PNEUMOCOCCAL POLYSACCHARIDE VACCINE AGE 65-64 HIGH RISK  Never done  . COVID-19 Vaccine (1) Never done  . OPHTHALMOLOGY EXAM  03/22/2019  . FOOT EXAM  09/06/2020

## 2020-11-08 ENCOUNTER — Encounter: Payer: 59 | Admitting: Family Medicine

## 2020-11-08 DIAGNOSIS — Z0000 Encounter for general adult medical examination without abnormal findings: Secondary | ICD-10-CM

## 2020-11-08 DIAGNOSIS — Z1159 Encounter for screening for other viral diseases: Secondary | ICD-10-CM

## 2020-11-08 DIAGNOSIS — E538 Deficiency of other specified B group vitamins: Secondary | ICD-10-CM

## 2020-11-08 DIAGNOSIS — E109 Type 1 diabetes mellitus without complications: Secondary | ICD-10-CM

## 2020-11-08 DIAGNOSIS — E559 Vitamin D deficiency, unspecified: Secondary | ICD-10-CM

## 2021-01-06 ENCOUNTER — Other Ambulatory Visit: Payer: Self-pay | Admitting: Family Medicine

## 2021-02-08 ENCOUNTER — Encounter: Payer: Self-pay | Admitting: Family Medicine

## 2021-02-21 DIAGNOSIS — E1065 Type 1 diabetes mellitus with hyperglycemia: Secondary | ICD-10-CM | POA: Diagnosis not present

## 2021-02-23 DIAGNOSIS — E1065 Type 1 diabetes mellitus with hyperglycemia: Secondary | ICD-10-CM | POA: Diagnosis not present

## 2021-03-07 ENCOUNTER — Encounter: Payer: 59 | Admitting: Family Medicine

## 2021-03-08 NOTE — Progress Notes (Signed)
Phone [phone]   Subjective:  Patient presents today for their annual physical. Chief complaint-noted.   See problem oriented charting- ROS- full  review of systems was completed and negative except for: weight gain  The following were reviewed and entered/updated in epic: Past Medical History:  Diagnosis Date  . Diabetes mellitus without complication (HCC)    Dr. Buddy Duty with [facility]. On insulin pump. Dexcom sensor that talks to the pump  . PCOS (polycystic ovarian syndrome)    metformin and actos in the past- didnt help that much. shaves daily.   . Seasonal allergies    zyrtec   Patient Active Problem List   Diagnosis Date Noted  . Type 1 diabetes mellitus ([place]) 01/20/2016    Priority: High  . Hyperlipidemia associated with type 2 diabetes mellitus ([place]) 03/09/2021    Priority: Medium  . PCOS (polycystic ovarian syndrome)     Priority: Medium  . Low vitamin B12 level 01/20/2016    Priority: Medium  . Vitamin D deficiency 08/18/2016    Priority: Low  . Seasonal allergies     Priority: Low  . Long term current use of insulin ([place]) 10/25/2015    Priority: Low  . Atypical chest pain 08/18/2016   Past Surgical History:  Procedure Laterality Date  . OVARIAN CYST REMOVAL  2013    Family History  Problem Relation Age of Onset  . Heart disease Paternal Grandfather 21       heart disease related  . Prostate cancer Paternal Grandfather   . Healthy Mother   . Hyperlipidemia Father        monitoring at present  . COPD Maternal Grandmother        never smoker- worked in [employer]  . Arthritis Maternal Grandmother   . Cancer Maternal Grandfather        prostate cancer & died at age 74  . Ovarian cancer Paternal Grandmother   . Lung cancer Maternal Uncle        smoker  . CAD Maternal Uncle        massive MI under age 77  . Colon cancer Neg Hx   . Pancreatic cancer Neg Hx   . Stomach cancer Neg Hx   . Esophageal cancer Neg Hx     Medications- reviewed  and updated Current Outpatient Medications  Medication Sig Dispense Refill  . cetirizine (ZYRTEC) 10 MG tablet Take 1 tablet by mouth once daily 90 tablet 0  . Cholecalciferol (VITAMIN D) 2000 units CAPS Take 1 capsule daily by mouth.    . CONTOUR NEXT TEST test strip     . GLUCAGON EMERGENCY 1 MG injection     . Insulin Human (INSULIN PUMP) SOLN Inject into the skin as directed. Humalog insulin pump    . insulin lispro (HUMALOG) 100 UNIT/ML injection     . rosuvastatin (CRESTOR) 10 MG tablet Take 1 tablet (10 mg total) by mouth once a week. 13 tablet 3  . vitamin B-12 (CYANOCOBALAMIN) 50 MCG tablet Take 500 mcg by mouth daily.      No current facility-administered medications for this visit.    Allergies-reviewed and updated No Known Allergies  Social History   Social History Narrative   Lives with wife in 2 story home.  Has 1 stepdaughter- not in the home.       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    Education: associates degree. [employer]  community college.       Hobbies: time with dogs ( 2 small Chihuahuas) older   Objective  Objective:  BP 120/70   Pulse 75   Temp 97.8 F (36.6 C) (Temporal)   Ht 5\' 6"  (1.676 m)   Wt 172 lb (78 kg)   LMP 02/21/2021   SpO2 97%   BMI 27.76 kg/m  Gen: NAD, resting comfortably HEENT: Mucous membranes are moist. Oropharynx normal Neck: no thyromegaly CV: RRR no murmurs rubs or gallops Lungs: CTAB no crackles, wheeze, rhonchi Abdomen: soft/nontender/nondistended/normal bowel sounds. No rebound or guarding.  Ext: no edema Skin: warm, dry Neuro: grossly normal, moves all extremities, PERRLA    Diabetic Foot Exam - Simple   Simple Foot Form Diabetic Foot exam was performed with the following findings: Yes 03/09/2021  8:28 AM  Visual Inspection No deformities, no ulcerations, no other skin breakdown bilaterally: Yes Sensation Testing Intact to touch and  monofilament testing bilaterally: Yes Pulse Check Posterior Tibialis and Dorsalis pulse intact bilaterally: Yes Comments        Assessment and Plan   40 y.o. female presenting for annual physical.  Health Maintenance counseling: 1. Anticipatory guidance: Patient counseled regarding regular dental exams -q6 months, eye exams - yearly,  avoiding smoking and second hand smoke , limiting alcohol to 1 beverage per day .   2. Risk factor reduction:  Advised patient of need for regular exercise and diet rich and fruits and vegetables to reduce risk of heart attack and stroke. Exercise- not at the gym lately- and has backed off at her walking with her exercise. Diet-weight trending up- discussed myfitnesspal trial.  Wt Readings from Last 3 Encounters:  03/09/21 172 lb (78 kg)  09/07/19 167 lb 3.2 oz (75.8 kg)  09/04/18 171 lb 6.1 oz (77.7 kg)  3. Immunizations/screenings/ancillary studies-patient wants to hold off on Prevnar 20, COVID-19 vaccination-she will send [place] the dates of her vaccination.  Discussed hepatitis C screening- opts in  Immunization History  Administered Date(s) Administered  . Influenza,inj,Quad PF,6+ Mos 07/17/2019  . Influenza-Unspecified 08/07/2016, 07/26/2017, 07/26/2018, 07/27/2020  . Pneumococcal Polysaccharide-23 11/09/2013  . Tdap 08/17/2016  4. Cervical cancer screening- last we have on file is June 18, 2016- patient reports having 1 since that time-we will update records-request release of information 5. Breast cancer screening-  breast exam with gynecology.  She is planning on yearly mammograms starting at age 73  6. Colon cancer screening -  no family history, start at age 54  7. Skin cancer screening-sees dermatology every 2 years. advised regular sunscreen use. Denies worrisome, changing, or new skin lesions.  8. Birth control/STD check-monogamous with female spouse  31. Osteoporosis screening at 81- we will plan on this sometime when she is  postmenopausal -Never smoker  Status of chronic or acute concerns   # Diabetes type I-follows with Dr. Cindra Eves: Medication: humalog through insulin pump CBGs-monitors with Guardian 3 sensor Lab Results  Component Value Date   HGBA1C 6.9 (A) 09/07/2019  A/P: hopefully controlled as has been in the past- update a1c and continue with Dr. Buddy Duty- we will plan to forward labs to him  #hyperlipidemia S: Medication:Rosuvastatin 10 mg once a week started after last labs A/P: hopefully improved on rosuvastatin once a week- update -also patient interested in coronary calcium scoring- I think that's reasonable with DM I and fmaily history   #Vitamin D deficiency S: Medication: Vit D 2000 units daily. A/P: hopefully controlled - continue vitmain D   #  B12 deficiency S: Current treatment/medication (oral vs. IM): vitamin b12 522mcg daily A/P: hopefully controlled-  Update with labs  #PCOS- untreated, patient still ovulate/gets regular cycles  #patient would like to use [place] patch for help with menstrual cycle- appears low risk- reasonable to trial. I do not think CBD would show in drug testing if required at work- I would write a letter if needed . Already schedules ibuprofen at beginning of her cycle. Bad enough that she has considered hysterectomy. Would be great if could reduce ibuprofen intake  Recommended follow up: Return in about 1 year (around 03/09/2022) for physical or sooner if needed.  Lab/Order associations: fasting   ICD-10-CM   1. Preventative health care  Z00.00   2. Type 1 diabetes mellitus without complication (HCC)  T02.4   3. Vitamin D deficiency  E55.9   4. Low vitamin B12 level  E53.8   5. Encounter for hepatitis C screening test for low risk patient  Z11.59   6. Hyperlipidemia associated with type 2 diabetes mellitus ([place])  E11.69    E78.5     No orders of the defined types were placed in this encounter.   Return precautions advised.  Garret Reddish,  MD

## 2021-03-08 NOTE — Patient Instructions (Addendum)
Please stop by lab before you go If you have mychart- we will send your results within 3 business days of [place] receiving them.  If you do not have mychart- we will call you about results within 5 business days of [place] receiving them.  *please also note that you will see labs on mychart as soon as they post. I will later go in and write notes on them- will say "notes from Dr. Yong Channel"  We will call you within two weeks about your referral to [facility] for coronary calcium scoring- should be $99 cash pay. If you do not hear within 2 weeks, give [place] a call.    Health Maintenance Due  Topic Date Due  . COVID-19 Vaccine (1) Will send mychart message with dates.  Never done  . OPHTHALMOLOGY EXAM Sign release form at checkout.   I also need you to sign a separate release of information for her last Pap smear from gynecology please 03/22/2019    Recommended follow up: Return in about 1 year (around 03/09/2022) for physical or sooner if needed.

## 2021-03-09 ENCOUNTER — Other Ambulatory Visit: Payer: Self-pay

## 2021-03-09 ENCOUNTER — Ambulatory Visit (INDEPENDENT_AMBULATORY_CARE_PROVIDER_SITE_OTHER): Payer: BC Managed Care – PPO | Admitting: Family Medicine

## 2021-03-09 ENCOUNTER — Encounter: Payer: Self-pay | Admitting: Family Medicine

## 2021-03-09 VITALS — BP 120/70 | HR 75 | Temp 97.8°F | Ht 66.0 in | Wt 172.0 lb

## 2021-03-09 DIAGNOSIS — Z0000 Encounter for general adult medical examination without abnormal findings: Secondary | ICD-10-CM | POA: Diagnosis not present

## 2021-03-09 DIAGNOSIS — E538 Deficiency of other specified B group vitamins: Secondary | ICD-10-CM | POA: Diagnosis not present

## 2021-03-09 DIAGNOSIS — E785 Hyperlipidemia, unspecified: Secondary | ICD-10-CM | POA: Diagnosis not present

## 2021-03-09 DIAGNOSIS — E109 Type 1 diabetes mellitus without complications: Secondary | ICD-10-CM

## 2021-03-09 DIAGNOSIS — E559 Vitamin D deficiency, unspecified: Secondary | ICD-10-CM | POA: Diagnosis not present

## 2021-03-09 DIAGNOSIS — E1169 Type 2 diabetes mellitus with other specified complication: Secondary | ICD-10-CM

## 2021-03-09 DIAGNOSIS — Z1159 Encounter for screening for other viral diseases: Secondary | ICD-10-CM | POA: Diagnosis not present

## 2021-03-09 LAB — LIPID PANEL
Cholesterol: 159 mg/dL (ref 0–200)
HDL: 62.5 mg/dL (ref >39.00)
LDL Cholesterol: 87 mg/dL (ref 0–99)
NonHDL: 96.83
Total CHOL/HDL Ratio: 3
Triglycerides: 49 mg/dL (ref 0.0–149.0)
VLDL: 9.8 mg/dL (ref 0.0–40.0)

## 2021-03-09 LAB — COMPREHENSIVE METABOLIC PANEL
ALT: 10 U/L (ref 0–35)
AST: 15 U/L (ref 0–37)
Albumin: 4.3 g/dL (ref 3.5–5.2)
Alkaline Phosphatase: 73 U/L (ref 39–117)
BUN: 16 mg/dL (ref 6–23)
CO2: 29 mEq/L (ref 19–32)
Calcium: 9.4 mg/dL (ref 8.4–10.5)
Chloride: 102 mEq/L (ref 96–112)
Creatinine, Ser: 0.75 mg/dL (ref 0.40–1.20)
GFR: 101.3 mL/min (ref >60.00)
Glucose, Bld: 178 mg/dL — ABNORMAL HIGH (ref 70–99)
Potassium: 3.9 mEq/L (ref 3.5–5.1)
Sodium: 138 mEq/L (ref 135–145)
Total Bilirubin: 0.4 mg/dL (ref 0.2–1.2)
Total Protein: 7.7 g/dL (ref 6.0–8.3)

## 2021-03-09 LAB — CBC WITH DIFFERENTIAL/PLATELET
Basophils Absolute: 0 10*3/uL (ref 0.0–0.1)
Basophils Relative: 0.4 % (ref 0.0–3.0)
Eosinophils Absolute: 0.1 10*3/uL (ref 0.0–0.7)
Eosinophils Relative: 0.9 % (ref 0.0–5.0)
HCT: 41.7 % (ref 36.0–46.0)
Hemoglobin: 14.2 g/dL (ref 12.0–15.0)
Lymphocytes Relative: 29.2 % (ref 12.0–46.0)
Lymphs Abs: 2.3 10*3/uL (ref 0.7–4.0)
MCHC: 34 g/dL (ref 30.0–36.0)
MCV: 85.2 fl (ref 78.0–100.0)
Monocytes Absolute: 0.5 10*3/uL (ref 0.1–1.0)
Monocytes Relative: 5.9 % (ref 3.0–12.0)
Neutro Abs: 5 10*3/uL (ref 1.4–7.7)
Neutrophils Relative %: 63.6 % (ref 43.0–77.0)
Platelets: 259 10*3/uL (ref 150.0–400.0)
RBC: 4.9 Mil/uL (ref 3.87–5.11)
RDW: 13.2 % (ref 11.5–15.5)
WBC: 7.9 10*3/uL (ref 4.0–10.5)

## 2021-03-09 LAB — HEMOGLOBIN A1C: Hgb A1c MFr Bld: 7.6 % — ABNORMAL HIGH (ref 4.6–6.5)

## 2021-03-09 LAB — VITAMIN B12: Vitamin B-12: 1512 pg/mL — ABNORMAL HIGH (ref 211–911)

## 2021-03-09 LAB — MICROALBUMIN / CREATININE URINE RATIO
Creatinine,U: 126.7 mg/dL
Microalb Creat Ratio: 0.6 mg/g (ref 0.0–30.0)
Microalb, Ur: <0.7 mg/dL (ref 0.0–1.9)

## 2021-03-09 LAB — TSH: TSH: 0.92 u[IU]/mL (ref 0.35–4.50)

## 2021-03-09 LAB — HM HEPATITIS C SCREENING LAB: HM Hepatitis Screen: NEGATIVE

## 2021-03-09 LAB — VITAMIN D 25 HYDROXY (VIT D DEFICIENCY, FRACTURES): VITD: 31.24 ng/mL (ref 30.00–100.00)

## 2021-03-09 MED ORDER — ROSUVASTATIN CALCIUM 10 MG PO TABS: 10.0000 mg | ORAL_TABLET | ORAL | 3 refills | Status: DC

## 2021-03-10 ENCOUNTER — Encounter: Payer: Self-pay | Admitting: Family Medicine

## 2021-03-10 LAB — HEPATITIS C ANTIBODY
Hepatitis C Ab: NONREACTIVE
SIGNAL TO CUT-OFF: 0.01 (ref <1.00)

## 2021-03-14 ENCOUNTER — Encounter: Payer: Self-pay | Admitting: Family Medicine

## 2021-03-22 ENCOUNTER — Ambulatory Visit (INDEPENDENT_AMBULATORY_CARE_PROVIDER_SITE_OTHER)
Admission: RE | Admit: 2021-03-22 | Discharge: 2021-03-22 | Disposition: A | Discharge status: Home / Self Care | Payer: Self-pay | Source: Ambulatory Visit | Attending: Family Medicine | Admitting: Family Medicine

## 2021-03-22 ENCOUNTER — Other Ambulatory Visit: Payer: Self-pay

## 2021-03-22 DIAGNOSIS — E785 Hyperlipidemia, unspecified: Secondary | ICD-10-CM

## 2021-03-22 DIAGNOSIS — E1169 Type 2 diabetes mellitus with other specified complication: Secondary | ICD-10-CM

## 2021-04-02 ENCOUNTER — Other Ambulatory Visit: Payer: Self-pay | Admitting: Family Medicine

## 2021-04-17 ENCOUNTER — Encounter: Payer: Self-pay | Admitting: Family Medicine

## 2021-04-25 DIAGNOSIS — E559 Vitamin D deficiency, unspecified: Secondary | ICD-10-CM | POA: Diagnosis not present

## 2021-04-25 DIAGNOSIS — E1065 Type 1 diabetes mellitus with hyperglycemia: Secondary | ICD-10-CM | POA: Diagnosis not present

## 2021-04-25 DIAGNOSIS — E282 Polycystic ovarian syndrome: Secondary | ICD-10-CM | POA: Diagnosis not present

## 2021-04-25 DIAGNOSIS — Z794 Long term (current) use of insulin: Secondary | ICD-10-CM | POA: Diagnosis not present

## 2021-05-17 ENCOUNTER — Encounter: Payer: Self-pay | Admitting: Family Medicine

## 2021-05-23 DIAGNOSIS — E1065 Type 1 diabetes mellitus with hyperglycemia: Secondary | ICD-10-CM | POA: Diagnosis not present

## 2021-05-29 NOTE — Telephone Encounter (Signed)
Please see message below. Thank You.

## 2021-06-01 NOTE — Telephone Encounter (Signed)
Patient is calling in asking if someone is able to give her a call back, as the bill is getting re-billed instead of getting fixed.

## 2021-06-06 NOTE — Telephone Encounter (Signed)
Patient is calling in about message below, requesting a call back.

## 2021-06-06 NOTE — Telephone Encounter (Signed)
I have emailed [facility] requesting to resubmit with Z11.59 as primary code for Hep C.   I have left pt vm to disregard current bill from [facility] as this will be resubmitted.

## 2021-06-06 NOTE — Telephone Encounter (Signed)
I have spoken with Dawn in regard to coding.  She is going to ask charge correction to resubmit with different coded.  I have left patient vm that she can disregard current bill as this will be running through insurance again.

## 2021-07-05 ENCOUNTER — Other Ambulatory Visit: Payer: Self-pay | Admitting: Family Medicine

## 2021-07-13 DIAGNOSIS — E1065 Type 1 diabetes mellitus with hyperglycemia: Secondary | ICD-10-CM | POA: Diagnosis not present

## 2021-07-13 DIAGNOSIS — E282 Polycystic ovarian syndrome: Secondary | ICD-10-CM | POA: Diagnosis not present

## 2021-07-13 DIAGNOSIS — Z794 Long term (current) use of insulin: Secondary | ICD-10-CM | POA: Diagnosis not present

## 2021-07-13 DIAGNOSIS — E559 Vitamin D deficiency, unspecified: Secondary | ICD-10-CM | POA: Diagnosis not present

## 2021-08-22 DIAGNOSIS — Z23 Encounter for immunization: Secondary | ICD-10-CM | POA: Diagnosis not present

## 2021-08-24 DIAGNOSIS — L578 Other skin changes due to chronic exposure to nonionizing radiation: Secondary | ICD-10-CM | POA: Diagnosis not present

## 2021-08-24 DIAGNOSIS — L821 Other seborrheic keratosis: Secondary | ICD-10-CM | POA: Diagnosis not present

## 2021-08-24 DIAGNOSIS — D225 Melanocytic nevi of trunk: Secondary | ICD-10-CM | POA: Diagnosis not present

## 2021-08-24 DIAGNOSIS — E1065 Type 1 diabetes mellitus with hyperglycemia: Secondary | ICD-10-CM | POA: Diagnosis not present

## 2021-08-25 ENCOUNTER — Encounter: Payer: Self-pay | Admitting: Family Medicine

## 2021-08-31 DIAGNOSIS — E1065 Type 1 diabetes mellitus with hyperglycemia: Secondary | ICD-10-CM | POA: Diagnosis not present

## 2021-09-27 DIAGNOSIS — D225 Melanocytic nevi of trunk: Secondary | ICD-10-CM | POA: Diagnosis not present

## 2021-09-27 DIAGNOSIS — L578 Other skin changes due to chronic exposure to nonionizing radiation: Secondary | ICD-10-CM | POA: Diagnosis not present

## 2021-09-27 DIAGNOSIS — L821 Other seborrheic keratosis: Secondary | ICD-10-CM | POA: Diagnosis not present

## 2021-10-03 ENCOUNTER — Other Ambulatory Visit: Payer: Self-pay | Admitting: Family Medicine

## 2021-10-22 DIAGNOSIS — E1065 Type 1 diabetes mellitus with hyperglycemia: Secondary | ICD-10-CM | POA: Diagnosis not present

## 2021-11-17 DIAGNOSIS — E119 Type 2 diabetes mellitus without complications: Secondary | ICD-10-CM | POA: Diagnosis not present

## 2021-11-29 DIAGNOSIS — E1065 Type 1 diabetes mellitus with hyperglycemia: Secondary | ICD-10-CM | POA: Diagnosis not present

## 2021-12-29 ENCOUNTER — Other Ambulatory Visit: Payer: Self-pay | Admitting: Family Medicine

## 2022-01-01 ENCOUNTER — Telehealth: Payer: BC Managed Care – PPO | Admitting: Family Medicine

## 2022-01-01 ENCOUNTER — Encounter: Payer: Self-pay | Admitting: Family Medicine

## 2022-01-01 ENCOUNTER — Other Ambulatory Visit: Payer: Self-pay

## 2022-01-01 DIAGNOSIS — U071 COVID-19: Secondary | ICD-10-CM

## 2022-01-01 NOTE — Patient Instructions (Signed)
Advised of CDC guidelines for self isolation/ ending isolation.  Advised of safe practice guidelines. Symptom Tier reviewed.  Encouraged to monitor for any worsening symptoms; watch for increased shortness of breath, weakness, and signs of dehydration. Advised when to seek emergency care.  Instructed to rest and hydrate well.  Advised to leave the house during recommended isolation period, only if it is necessary to seek medical care  

## 2022-01-01 NOTE — Progress Notes (Signed)
? ? ?MyChart Video Visit ? ? ? ?Virtual Visit via Video Note  ? ?This visit type was conducted due to national recommendations for restrictions regarding the COVID-19 Pandemic (e.g. social distancing) in an effort to limit this patient's exposure and mitigate transmission in our community. This patient is at least at moderate risk for complications without adequate follow up. This format is felt to be most appropriate for this patient at this time. Physical exam was limited by quality of the video and audio technology used for the visit. CMA was able to get the patient set up on a video visit. ? ?Patient location: Home. Patient and provider in visit ?Provider location: Office ? ?I discussed the limitations of evaluation and management by telemedicine and the availability of in person appointments. The patient expressed understanding and agreed to proceed. ? ?Visit Date: 01/01/2022 ? ?Today's healthcare provider: Wellington Hampshire, MD  ? ? ? ?Subjective:  ? ? Patient ID: Christine Compton, female    DOB: 02-24-1981, 41 y.o.   MRN: [id number] ? ?Chief Complaint  ?Patient presents with  ? Covid Positive  ? Nasal Congestion  ? Cough  ?  Non productive ?Tested positive yesterday  ?  ? Headache  ?  Off and on   ? ? ?HPI ?Chief complaint: congested ?Symptom onset: 1 wk congested, thought allergies.  Covid neg on wed, fri, sat.  Then getting worse and cough worsening and tested sun morn and +.  Had some aches as well.  Getting better. ?Pertinent positives: above ?Pertinent negatives: covid neg last wed ?Treatments tried:  ?Vaccine status:  ?Sick exposure: none but now wife is +  ? ?Past Medical History:  ?Diagnosis Date  ? Diabetes mellitus without complication ([place])   ? Dr. Buddy Duty with [facility]. On insulin pump. Dexcom sensor that talks to the pump  ? PCOS (polycystic ovarian syndrome)   ? metformin and actos in the past- didnt help that much. shaves daily.   ? Seasonal allergies   ? zyrtec  ? ? ?Past Surgical History:   ?Procedure Laterality Date  ? OVARIAN CYST REMOVAL  2013  ? ? ?Outpatient Medications Prior to Visit  ?Medication Sig Dispense Refill  ? cetirizine (ZYRTEC) 10 MG tablet Take 1 tablet by mouth once daily 90 tablet 0  ? Cholecalciferol (VITAMIN D) 2000 units CAPS Take 1 capsule daily by mouth.    ? CONTOUR NEXT TEST test strip     ? GLUCAGON EMERGENCY 1 MG injection     ? Insulin Human (INSULIN PUMP) SOLN Inject into the skin as directed. Humalog insulin pump    ? insulin lispro (HUMALOG) 100 UNIT/ML injection     ? NOVOLOG 100 UNIT/ML injection SMARTSIG:0-67 Unit(s) SUB-Q Daily    ? rosuvastatin (CRESTOR) 10 MG tablet Take 1 tablet (10 mg total) by mouth once a week. 13 tablet 3  ? vitamin B-12 (CYANOCOBALAMIN) 50 MCG tablet Take 500 mcg by mouth daily.     ? ?No facility-administered medications prior to visit.  ? ? ?No Known Allergies ? ? ?   ?Objective:  ?  ? ?Physical Exam  ?Vitals and nursing note reviewed.  ?Constitutional:   ?   General:  is not in acute distress. ?   Appearance: Normal appearance.  ?HENT:  ?   Head: Normocephalic.  ?Pulmonary:  ?   Effort: No respiratory distress.  ?Skin: ?   General: Skin is dry.  ?   Coloration: Skin is not pale.  ?Neurological:  ?  Mental Status: Pt is alert and oriented to person, place, and time.  ?Psychiatric:     ?   Mood and Affect: Mood normal.  ? ?There were no vitals taken for this visit. ? ?Wt Readings from Last 3 Encounters:  ?03/09/21 172 lb (78 kg)  ?09/07/19 167 lb 3.2 oz (75.8 kg)  ?09/04/18 171 lb 6.1 oz (77.7 kg)  ? ? ?   ?Assessment & Plan:  ? ?Problem List Items Addressed This Visit   ?None ?Visit Diagnoses   ? ? COVID-19    -  Primary  ? ?  ? Covid 19-hard to say exactly when symptoms started.  Could be >1wk and false neg tests or URI/allergies, then Covid hit on Saturday.  Pt doing ok and prefers no meds.  Will watch how does tonight and if worsening, then will call tomorrow for meds.   ? ?No orders of the defined types were placed in this  encounter. ? ? ?I discussed the assessment and treatment plan with the patient. The patient was provided an opportunity to ask questions and all were answered. The patient agreed with the plan and demonstrated an understanding of the instructions. ?  ?The patient was advised to call back or seek an in-person evaluation if the symptoms worsen or if the condition fails to improve as anticipated. ? ?I provided 15 minutes of face-to-face time during this encounter. ? ? ?Wellington Hampshire, MD ?[place] ?[phone] (phone) ?[phone] (fax) ? ?[facility]  ?

## 2022-01-22 DIAGNOSIS — E1065 Type 1 diabetes mellitus with hyperglycemia: Secondary | ICD-10-CM | POA: Diagnosis not present

## 2022-01-22 DIAGNOSIS — E559 Vitamin D deficiency, unspecified: Secondary | ICD-10-CM | POA: Diagnosis not present

## 2022-01-22 DIAGNOSIS — E282 Polycystic ovarian syndrome: Secondary | ICD-10-CM | POA: Diagnosis not present

## 2022-01-22 DIAGNOSIS — Z794 Long term (current) use of insulin: Secondary | ICD-10-CM | POA: Diagnosis not present

## 2022-01-25 DIAGNOSIS — E1065 Type 1 diabetes mellitus with hyperglycemia: Secondary | ICD-10-CM | POA: Diagnosis not present

## 2022-02-24 ENCOUNTER — Telehealth: Payer: Self-pay | Admitting: Family Medicine

## 2022-02-26 DIAGNOSIS — E1065 Type 1 diabetes mellitus with hyperglycemia: Secondary | ICD-10-CM | POA: Diagnosis not present

## 2022-03-06 NOTE — Telephone Encounter (Addendum)
Patient has scheduled cpe for 10/31.  Would like to know if medication can be filled through that date?  Please follow up with patient in regard at [phone].

## 2022-03-06 NOTE — Telephone Encounter (Signed)
Yes thanks-if it is just rosuvastatin can do 90 with 3 refills ?

## 2022-03-06 NOTE — Telephone Encounter (Signed)
Is this ok?

## 2022-03-07 MED ORDER — ROSUVASTATIN CALCIUM 10 MG PO TABS: 10.0000 mg | ORAL_TABLET | ORAL | 3 refills | Status: DC

## 2022-03-07 NOTE — Addendum Note (Signed)
Addended by: Clyde Lundborg A on: 03/07/2022 08:58 AM ? ? Modules accepted: Orders ? ?

## 2022-03-07 NOTE — Telephone Encounter (Signed)
Rx sent to pharmacy   

## 2022-07-16 ENCOUNTER — Encounter: Payer: Self-pay | Admitting: *Deleted

## 2022-08-14 ENCOUNTER — Encounter: Payer: BC Managed Care – PPO | Admitting: Family Medicine

## 2022-08-21 ENCOUNTER — Ambulatory Visit (INDEPENDENT_AMBULATORY_CARE_PROVIDER_SITE_OTHER): Payer: No Typology Code available for payment source | Admitting: Family Medicine

## 2022-08-21 ENCOUNTER — Encounter: Payer: Self-pay | Admitting: Family Medicine

## 2022-08-21 VITALS — BP 100/80 | HR 65 | Temp 98.0°F | Ht 66.0 in | Wt 172.0 lb

## 2022-08-21 DIAGNOSIS — E559 Vitamin D deficiency, unspecified: Secondary | ICD-10-CM | POA: Diagnosis not present

## 2022-08-21 DIAGNOSIS — R7989 Other specified abnormal findings of blood chemistry: Secondary | ICD-10-CM

## 2022-08-21 DIAGNOSIS — E1169 Type 2 diabetes mellitus with other specified complication: Secondary | ICD-10-CM | POA: Diagnosis not present

## 2022-08-21 DIAGNOSIS — E785 Hyperlipidemia, unspecified: Secondary | ICD-10-CM

## 2022-08-21 DIAGNOSIS — Z87898 Personal history of other specified conditions: Secondary | ICD-10-CM

## 2022-08-21 DIAGNOSIS — Z0000 Encounter for general adult medical examination without abnormal findings: Secondary | ICD-10-CM | POA: Diagnosis not present

## 2022-08-21 DIAGNOSIS — E109 Type 1 diabetes mellitus without complications: Secondary | ICD-10-CM

## 2022-08-21 MED ORDER — GVOKE HYPOPEN 2-PACK 0.5 MG/0.1ML ~~LOC~~ SOAJ: 0.5000 mg | Freq: Every day | SUBCUTANEOUS | 1 refills | Status: AC | PRN

## 2022-08-21 MED ORDER — ROSUVASTATIN CALCIUM 10 MG PO TABS
10.0000 mg | ORAL_TABLET | ORAL | 3 refills | Status: DC
Start: 2022-08-21 — End: 2023-06-05

## 2022-08-21 NOTE — Patient Instructions (Addendum)
Sign release of information at the check out desk for PAP SMEAR and DIABETIC EYE EXAM.  Schedule a lab visit at the check out desk within 2 weeks. Return for future fasting labs meaning nothing but water after midnight please. Ok to take your medications with water.   Recommended follow up: Return in about 1 year (around 08/22/2023) for physical or sooner if needed.Schedule b4 you leave.

## 2022-08-21 NOTE — Progress Notes (Signed)
Phone [phone]   Subjective:  Patient presents today for their annual physical. Chief complaint-noted.   See problem oriented charting- ROS- full  review of systems was completed and negative except for: painful menstruation (patch did not help- plans to chat with GYN)  The following were reviewed and entered/updated in epic: Past Medical History:  Diagnosis Date   Diabetes mellitus without complication ([place])    Dr. Buddy Duty with [facility]. On insulin pump. Dexcom sensor that talks to the pump   PCOS (polycystic ovarian syndrome)    metformin and actos in the past- didnt help that much. shaves daily.    Seasonal allergies    zyrtec   Patient Active Problem List   Diagnosis Date Noted   Type 1 diabetes mellitus ([place]) 01/20/2016    Priority: High   Hyperlipidemia associated with type 2 diabetes mellitus ([place]) 03/09/2021    Priority: Medium    PCOS (polycystic ovarian syndrome)     Priority: Medium    Low vitamin B12 level 01/20/2016    Priority: Medium    Vitamin D deficiency 08/18/2016    Priority: Low   Seasonal allergies     Priority: Low   Long term current use of insulin ([place]) 10/25/2015    Priority: Low   Atypical chest pain 08/18/2016   Past Surgical History:  Procedure Laterality Date   OVARIAN CYST REMOVAL  2013    Family History  Problem Relation Age of Onset   Heart disease Paternal Grandfather 83       heart disease related   Prostate cancer Paternal Grandfather    Healthy Mother    Hyperlipidemia Father        monitoring at present   COPD Maternal Grandmother        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   Arthritis Maternal Grandmother    Cancer Maternal Grandfather        prostate cancer & died at age 88   Ovarian cancer Paternal Grandmother    Lung cancer Maternal Uncle        smoker   CAD Maternal Uncle        massive MI under age 31   Colon cancer Neg Hx    Pancreatic cancer Neg Hx    Stomach cancer Neg Hx    Esophageal cancer Neg Hx      Medications- reviewed and updated Current Outpatient Medications  Medication Sig Dispense Refill   cetirizine (ZYRTEC) 10 MG tablet Take 1 tablet by mouth once daily 90 tablet 0   Cholecalciferol (VITAMIN D) 2000 units CAPS Take 1 capsule daily by mouth.     GLUCAGON EMERGENCY 1 MG injection      Insulin Human (INSULIN PUMP) SOLN Inject into the skin as directed. Humalog insulin pump     insulin lispro (HUMALOG) 100 UNIT/ML injection      NOVOLOG 100 UNIT/ML injection SMARTSIG:0-67 Unit(s) SUB-Q Daily     rosuvastatin (CRESTOR) 10 MG tablet Take 1 tablet (10 mg total) by mouth once a week. 13 tablet 3   vitamin B-12 (CYANOCOBALAMIN) 50 MCG tablet Take 500 mcg by mouth daily.      CONTOUR NEXT TEST test strip      No current facility-administered medications for this visit.    Allergies-reviewed and updated No Known Allergies  Social History   Social History Narrative   Lives with wife in 2 story home.  Has 1 stepdaughter- not in the home.       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    Education: associates degree. [employer].       Hobbies: time with dogs ( 2 small Chihuahuas) older   Objective  Objective:  BP 100/80   Pulse 65   Temp 98 F (36.7 C)   Ht '5\' 6"'$  (1.676 m)   Wt 172 lb (78 kg)   SpO2 98%   BMI 27.76 kg/m  Gen: NAD, resting comfortably HEENT: Mucous membranes are moist. Oropharynx normal Neck: no thyromegaly CV: RRR no murmurs rubs or gallops Lungs: CTAB no crackles, wheeze, rhonchi Abdomen: soft/nontender/nondistended/normal bowel sounds. No rebound or guarding.  Ext: no edema Skin: warm, dry Neuro: grossly normal, moves all extremities, PERRLA    Diabetic Foot Exam - Simple   Simple Foot Form Diabetic Foot exam was performed with the following findings: Yes 08/21/2022  4:22 PM  Visual Inspection No deformities, no ulcerations, no other skin breakdown  bilaterally: Yes Sensation Testing Intact to touch and monofilament testing bilaterally: Yes Pulse Check Posterior Tibialis and Dorsalis pulse intact bilaterally: Yes Comments        Assessment and Plan   41 y.o. female presenting for annual physical.  Health Maintenance counseling: 1. Anticipatory guidance: Patient counseled regarding regular dental exams -q6 months, eye exams -yearly,  avoiding smoking and second hand smoke , limiting alcohol to 1 beverage per day- doesn't drink at all , no illicit drugs .   2. Risk factor reduction:  Advised patient of need for regular exercise and diet rich and fruits and vegetables to reduce risk of heart attack and stroke.  Exercise- plans to improve some- doing 2-3 days a week walking.  Diet/weight management-weight stable from last physical- feels needs to cut down on snacking and unhealthy snacking at that- feels eats healthy meals Wt Readings from Last 3 Encounters:  08/21/22 172 lb (78 kg)  03/09/21 172 lb (78 kg)  09/07/19 167 lb 3.2 oz (75.8 kg)  3. Immunizations/screenings/ancillary studies-opts out of Prevnar 20, COVID-19 vaccination considering at pharmacy - had covid march this year most recently Immunization History  Administered Date(s) Administered   Influenza,inj,Quad PF,6+ Mos 07/17/2019, 08/21/2021   Influenza-Unspecified 08/07/2016, 07/26/2017, 07/26/2018, 07/27/2020, 08/21/2021, 07/29/2022   Moderna Sars-Covid-2 Vaccination 11/27/2019, 12/26/2019, 09/10/2020   Pneumococcal Polysaccharide-23 11/09/2013   Tdap 08/17/2016  4. Cervical cancer screening- last we have on file is June 18, 2016- patient reports having 1 since that time-we will update records-request release of information- again this year 5. Breast cancer screening-  breast exam with gynecology.  She is planning on yearly mammograms starting at age 22   6. Colon cancer screening -  no family history, start at age 24   7. Skin cancer screening-sees dermatology every  year now . advised regular sunscreen use. Denies worrisome, changing, or new skin lesions.  8. Birth control/STD check-monogamous with female spouse   31. Osteoporosis screening at 18- we will plan on this sometime when she is postmenopausal. No family history osteoporosis -Never smoker   Status of chronic or acute concerns   #Right shoulder/arm pain with paresthesias but mainly hand (some tension into neck)- doing online PT and was helping but has pulled back and seems to be worsening. Has worsened- can wake up with throbbing pain- has to get up and move around and helps her- happens about twice a week. Occasionally had goes numb at work. Mainly pinky and 4th finger.  -wants to restart  exercises and if not improving consider sports medicine -considered prednisone but could rive sugars up  # Diabetes type I-follows with Dr. Buddy Duty S: Medication: humalog through insulin pump loop.  Reports 7.8 in april CBGs-sparing lows- knows how to address Lab Results  Component Value Date   HGBA1C 7.6 (H) 03/09/2021   HGBA1C 6.9 (A) 09/07/2019   HGBA1C 7.4 08/06/2018  A/P:  hopefully stable- update A1c today. Continue current meds for now and endocrinology follow-up -we will forward results to Dr. Buddy Duty  #hyperlipidemia on 03/22/2021 CT calcium scoring of 0 S: Medication:Rosuvastatin 10 mg once a week Lab Results  Component Value Date   CHOL 159 03/09/2021   HDL 62.50 03/09/2021   LDLCALC 87 03/09/2021   LDLDIRECT 109.0 08/17/2016   TRIG 49.0 03/09/2021   CHOLHDL 3 03/09/2021   A/P: Reasonable control given prior CT calcium scoring-update lipid panel with labs- would not change unless significant worsening   #Vitamin D deficiency S: Medication: Vit D 2000 units daily. Last vitamin D Lab Results  Component Value Date   VD25OH 31.24 03/09/2021  A/P:    hopefully stable- update vitamin D today. Continue current meds for now   # B12 deficiency S: Current treatment/medication (oral vs. IM): vitamin  b12 565mg daily A/P:   hopefully stable- update B12 of today. Continue current meds for now   #PCOS- untreated, patient still ovulate/gets regular cycles- but very painful  #urine color change- happened a day after dragonfruit - also takes b12- noted an orange color- we will check UA to evaluate further  Recommended follow up: Return in about 1 year (around 08/22/2023) for physical or sooner if needed.Schedule b4 you leave.  Lab/Order associations: Will return fasting   ICD-10-CM   1. Preventative health care  Z00.00     2. Hyperlipidemia associated with type 2 diabetes mellitus ([place]  E11.69    E78.5     3. Low vitamin B12 level  R79.89     4. Vitamin D deficiency  E55.9     5. History of urine color changes  Z87.898     6. Type 1 diabetes mellitus without complication (HCC)  EQ25.9      No orders of the defined types were placed in this encounter.   Return precautions advised.  SGarret Reddish MD

## 2022-08-22 ENCOUNTER — Encounter: Payer: Self-pay | Admitting: Family Medicine

## 2022-08-23 ENCOUNTER — Other Ambulatory Visit (INDEPENDENT_AMBULATORY_CARE_PROVIDER_SITE_OTHER): Payer: No Typology Code available for payment source

## 2022-08-23 DIAGNOSIS — E109 Type 1 diabetes mellitus without complications: Secondary | ICD-10-CM

## 2022-08-23 DIAGNOSIS — E559 Vitamin D deficiency, unspecified: Secondary | ICD-10-CM

## 2022-08-23 DIAGNOSIS — Z87898 Personal history of other specified conditions: Secondary | ICD-10-CM

## 2022-08-23 DIAGNOSIS — E1169 Type 2 diabetes mellitus with other specified complication: Secondary | ICD-10-CM | POA: Diagnosis not present

## 2022-08-23 DIAGNOSIS — Z0000 Encounter for general adult medical examination without abnormal findings: Secondary | ICD-10-CM

## 2022-08-23 DIAGNOSIS — E785 Hyperlipidemia, unspecified: Secondary | ICD-10-CM | POA: Diagnosis not present

## 2022-08-23 DIAGNOSIS — R7989 Other specified abnormal findings of blood chemistry: Secondary | ICD-10-CM | POA: Diagnosis not present

## 2022-08-23 LAB — VITAMIN D 25 HYDROXY (VIT D DEFICIENCY, FRACTURES): VITD: 28.48 ng/mL — ABNORMAL LOW (ref 30.00–100.00)

## 2022-08-23 LAB — CBC WITH DIFFERENTIAL/PLATELET
Basophils Absolute: 0.1 10*3/uL (ref 0.0–0.1)
Basophils Relative: 0.7 % (ref 0.0–3.0)
Eosinophils Absolute: 0.1 10*3/uL (ref 0.0–0.7)
Eosinophils Relative: 1 % (ref 0.0–5.0)
HCT: 43 % (ref 36.0–46.0)
Hemoglobin: 14.2 g/dL (ref 12.0–15.0)
Lymphocytes Relative: 34.2 % (ref 12.0–46.0)
Lymphs Abs: 2.5 10*3/uL (ref 0.7–4.0)
MCHC: 33 g/dL (ref 30.0–36.0)
MCV: 85.3 fl (ref 78.0–100.0)
Monocytes Absolute: 0.5 10*3/uL (ref 0.1–1.0)
Monocytes Relative: 7.1 % (ref 3.0–12.0)
Neutro Abs: 4.2 10*3/uL (ref 1.4–7.7)
Neutrophils Relative %: 57 % (ref 43.0–77.0)
Platelets: 260 10*3/uL (ref 150.0–400.0)
RBC: 5.04 Mil/uL (ref 3.87–5.11)
RDW: 13.1 % (ref 11.5–15.5)
WBC: 7.3 10*3/uL (ref 4.0–10.5)

## 2022-08-23 LAB — MICROALBUMIN / CREATININE URINE RATIO
Creatinine,U: 122.5 mg/dL
Microalb Creat Ratio: 0.6 mg/g (ref 0.0–30.0)
Microalb, Ur: <0.7 mg/dL (ref 0.0–1.9)

## 2022-08-23 LAB — URINALYSIS, ROUTINE W REFLEX MICROSCOPIC
Bilirubin Urine: NEGATIVE
Ketones, ur: NEGATIVE
Leukocytes,Ua: NEGATIVE
Nitrite: NEGATIVE
RBC / HPF: NONE SEEN (ref 0–?)
Specific Gravity, Urine: 1.015 (ref 1.000–1.030)
Total Protein, Urine: NEGATIVE
Urine Glucose: 250 — AB
Urobilinogen, UA: 0.2 (ref 0.0–1.0)
pH: 6 (ref 5.0–8.0)

## 2022-08-23 LAB — LIPID PANEL
Cholesterol: 156 mg/dL (ref 0–200)
HDL: 63.3 mg/dL (ref >39.00)
LDL Cholesterol: 84 mg/dL (ref 0–99)
NonHDL: 92.82
Total CHOL/HDL Ratio: 2
Triglycerides: 46 mg/dL (ref 0.0–149.0)
VLDL: 9.2 mg/dL (ref 0.0–40.0)

## 2022-08-23 LAB — COMPREHENSIVE METABOLIC PANEL
ALT: 10 U/L (ref 0–35)
AST: 15 U/L (ref 0–37)
Albumin: 4 g/dL (ref 3.5–5.2)
Alkaline Phosphatase: 70 U/L (ref 39–117)
BUN: 16 mg/dL (ref 6–23)
CO2: 30 mEq/L (ref 19–32)
Calcium: 9.4 mg/dL (ref 8.4–10.5)
Chloride: 103 mEq/L (ref 96–112)
Creatinine, Ser: 0.69 mg/dL (ref 0.40–1.20)
GFR: 109.3 mL/min (ref >60.00)
Glucose, Bld: 150 mg/dL — ABNORMAL HIGH (ref 70–99)
Potassium: 4.3 mEq/L (ref 3.5–5.1)
Sodium: 139 mEq/L (ref 135–145)
Total Bilirubin: 0.5 mg/dL (ref 0.2–1.2)
Total Protein: 7.4 g/dL (ref 6.0–8.3)

## 2022-08-23 LAB — VITAMIN B12: Vitamin B-12: 565 pg/mL (ref 211–911)

## 2022-08-23 LAB — HEMOGLOBIN A1C: Hgb A1c MFr Bld: 7.5 % — ABNORMAL HIGH (ref 4.6–6.5)

## 2022-08-30 ENCOUNTER — Encounter: Payer: Self-pay | Admitting: Family Medicine

## 2022-11-27 LAB — HM PAP SMEAR: HPV, high-risk: NEGATIVE

## 2022-12-25 ENCOUNTER — Encounter: Payer: Self-pay | Admitting: Family Medicine

## 2022-12-25 NOTE — Telephone Encounter (Signed)
Called and scheduled pt for 12/31/22 @ 4pm vv. Pt states she is set to start birth control on 3/6. States would like to know if pcp has any concerns with that or not. Please Advise.

## 2022-12-31 ENCOUNTER — Telehealth: Payer: No Typology Code available for payment source | Admitting: Family Medicine

## 2023-01-07 LAB — HM DIABETES EYE EXAM

## 2023-02-07 ENCOUNTER — Encounter: Payer: Self-pay | Admitting: Family Medicine

## 2023-02-08 NOTE — Telephone Encounter (Signed)
Noted  

## 2023-03-25 ENCOUNTER — Encounter: Payer: Self-pay | Admitting: Family Medicine

## 2023-04-26 IMAGING — CT CT CARDIAC CORONARY ARTERY CALCIUM SCORE
3 series · 14 of 20 positions shown, 15 images · non-contrast
Comparison: None.
COMPARISON: None.

Addendum:
EXAM:
OVER-READ INTERPRETATION  CT CHEST

The following report is an over-read performed by radiologist Dr.
Nivirus Databex [REDACTED] on 03/22/2021. This over-read
does not include interpretation of cardiac or coronary anatomy or
pathology. The coronary calcium score interpretation by the
cardiologist is attached.
CLINICAL DATA: 40F with diabetes mellitus type I and hyperlipidemia
for cardiovascular disease risk stratification
Coronary Calcium Score
TECHNIQUE: A gated, non-contrast computed tomography scan of the heart was
performed using 3mm slice thickness. Axial images were analyzed on a
dedicated workstation. Calcium scoring of the coronary arteries was
performed using the Agatston method.

[Series 2: casc 3.0 bv41 2 bestdiast 68 % · axial · 0.40mm/px · z∈[-156,-90]mm · 4 of 38 slices shown, 5 images]
[im 8/38  vessel]
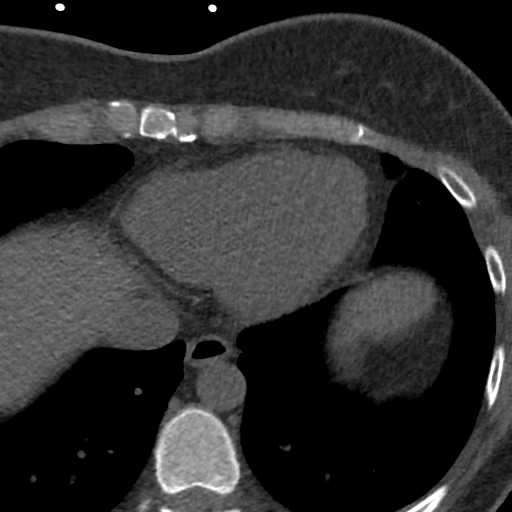
[im 8/38  lung]
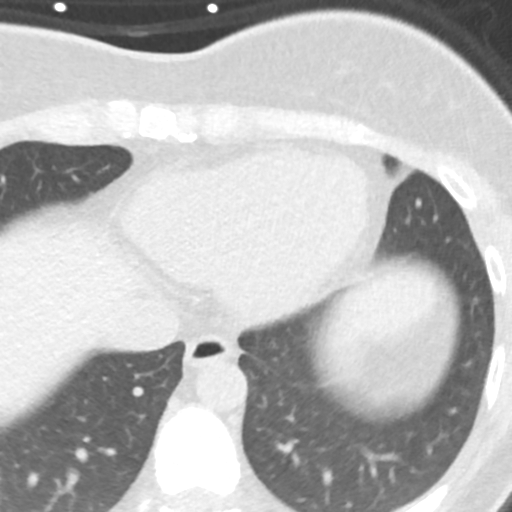
[im 15/38  vessel]
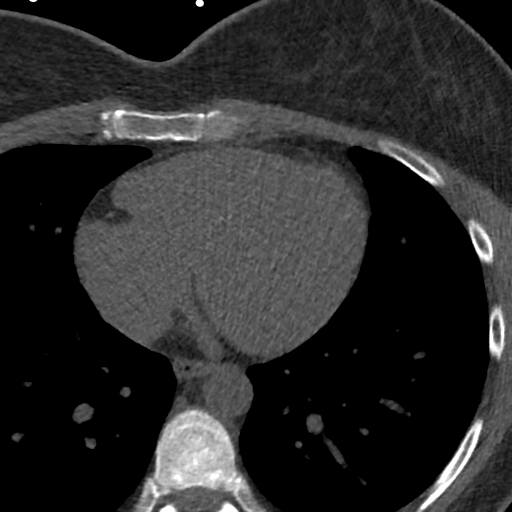
[im 23/38  vessel]
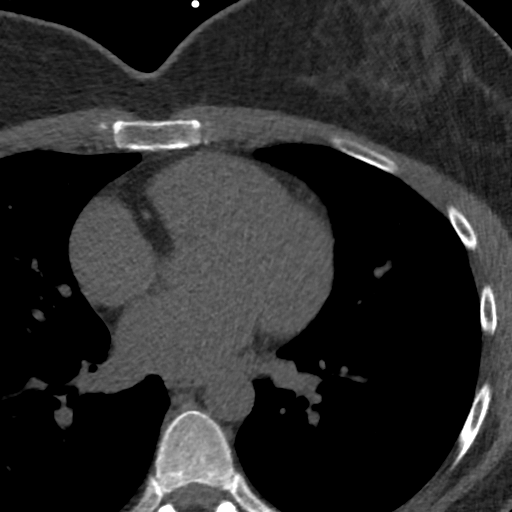
[im 30/38  vessel]
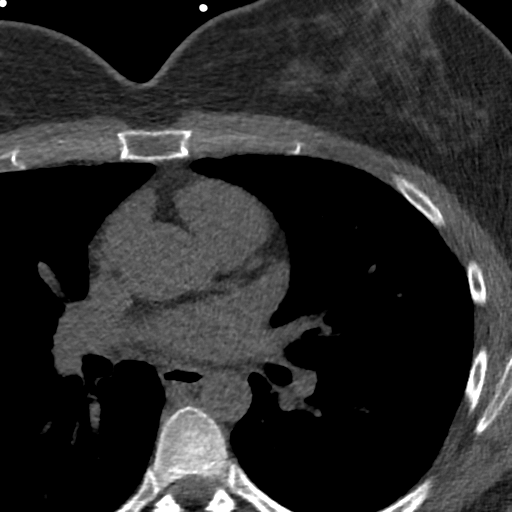

[Series 3: lung 68 % · axial · 0.66mm/px · z∈[-160,-88]mm · 5 of 38 slices shown]
[im 7/38  lung]
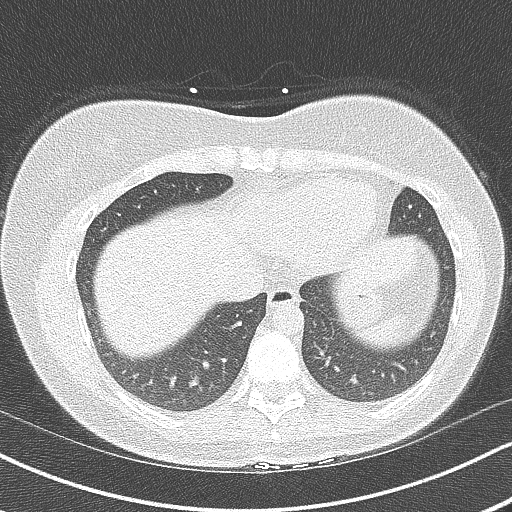
[im 13/38  lung]
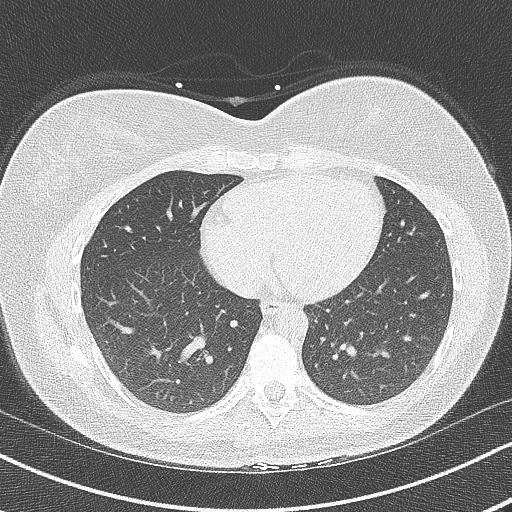
[im 19/38  lung]
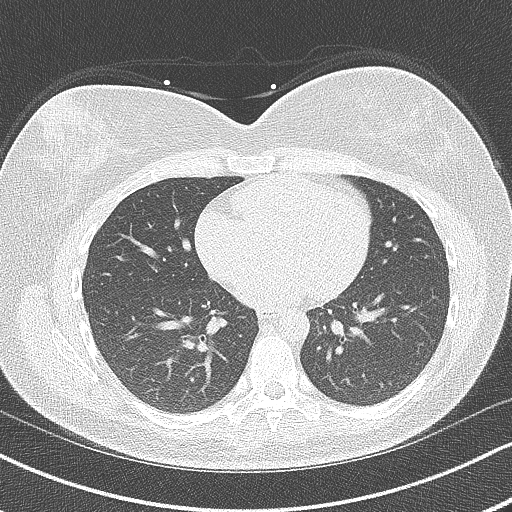
[im 25/38  lung]
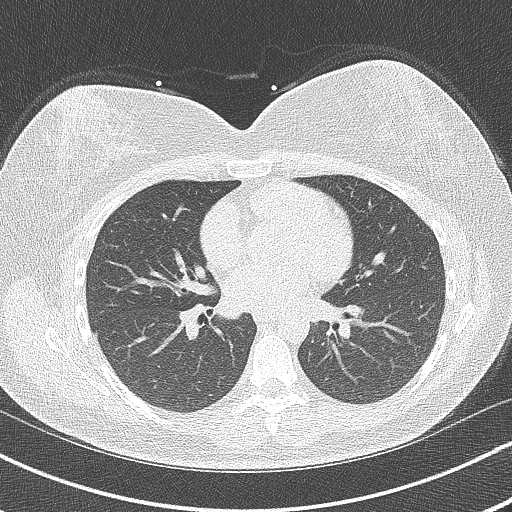
[im 31/38  lung]
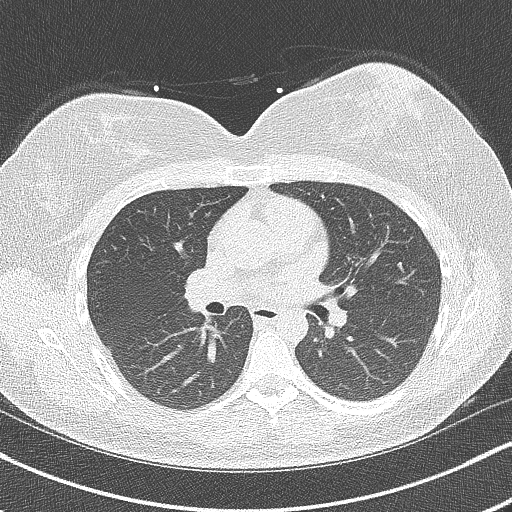

[Series 4: lung st 68 % · axial · 0.66mm/px · z∈[-160,-88]mm · 5 of 38 slices shown]
[im 7/38  lung]
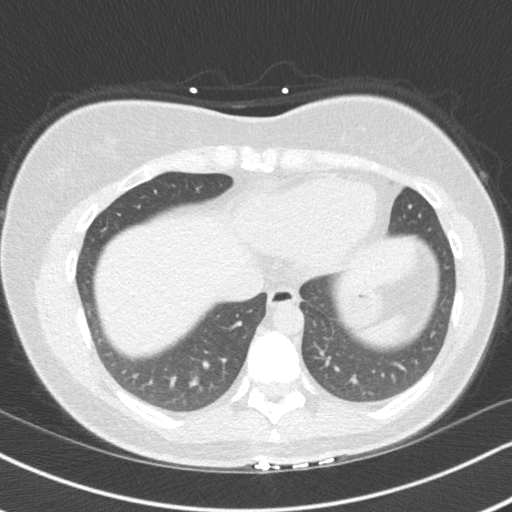
[im 13/38  lung]
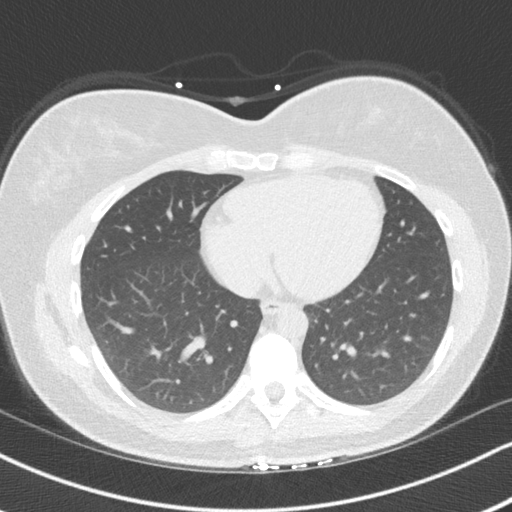
[im 19/38  lung]
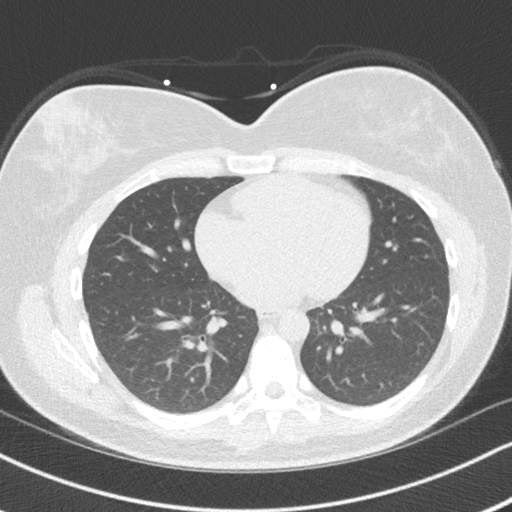
[im 25/38  lung]
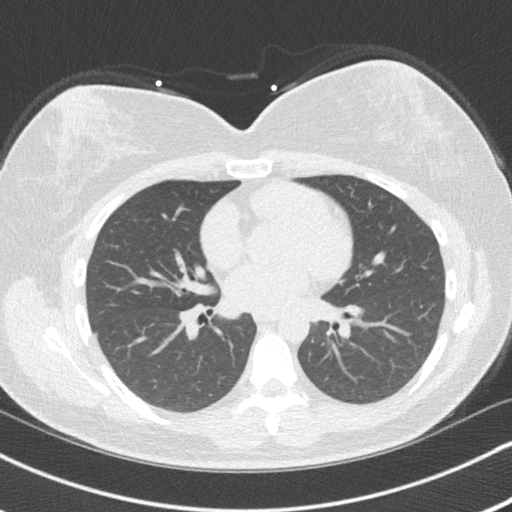
[im 31/38  lung]
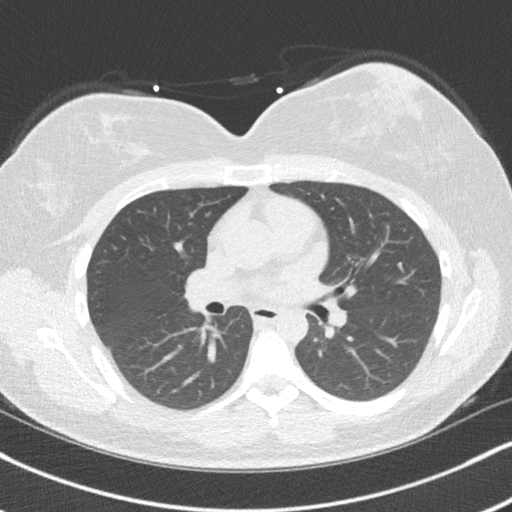

[14 of 20 positions shown; findings below may reference images not displayed]

FINDINGS: Vascular: Heart is normal size.  Aorta normal caliber.

Mediastinum/Nodes: No adenopathy

Lungs/Pleura: No confluent opacities or effusions.

Upper Abdomen: Imaging into the upper abdomen demonstrates no acute
findings.

Musculoskeletal: Chest wall soft tissues are unremarkable. No acute
bony abnormality.
IMPRESSION: No acute or significant extracardiac abnormality.
FINDINGS: Coronary arteries: Normal origins.

Coronary Calcium Score:  0

Percentile: 0

Pericardium: Normal.

Ascending Aorta: Normal caliber.

Non-cardiac: See separate report from [REDACTED].
IMPRESSION: Coronary calcium score of 0. This was 0 percentile for age-, race-,
and sex-matched controls.



If CAC=0, it is reasonable to withhold statin therapy and reassess
in 5 to 10 years, as long as higher risk conditions are absent
(diabetes mellitus, family history of premature CHD in first degree
relatives (males <55 years; females <65 years), cigarette smoking,
or LDL >=190 mg/dL).

If CAC is 1 to 99, it is reasonable to initiate statin therapy for
patients >=55 years of age.

If CAC is >=100 or >=75th percentile, it is reasonable to initiate
statin therapy at any age.

Cardiology referral should be considered for patients with CAC
scores >=400 or >=75th percentile.

*9490 AHA/ACC/AACVPR/AAPA/ABC/LEENA/RENE OSVALDO/SUEDE/Ilidio/AMNON/HAARSTUDIO/LUCCIN
Guideline on the Management of Blood Cholesterol: A Report of the
American College of Cardiology/American Heart Association Task Force
on Clinical Practice Guidelines. J Am Coll Cardiol.
4148;73(24):4486-4186.

*** End of Addendum ***
EXAM:
OVER-READ INTERPRETATION  CT CHEST

The following report is an over-read performed by radiologist Dr.
Nivirus Databex [REDACTED] on 03/22/2021. This over-read
does not include interpretation of cardiac or coronary anatomy or
pathology. The coronary calcium score interpretation by the
cardiologist is attached.
FINDINGS: Vascular: Heart is normal size.  Aorta normal caliber.

Mediastinum/Nodes: No adenopathy

Lungs/Pleura: No confluent opacities or effusions.

Upper Abdomen: Imaging into the upper abdomen demonstrates no acute
findings.

Musculoskeletal: Chest wall soft tissues are unremarkable. No acute
bony abnormality.
IMPRESSION: No acute or significant extracardiac abnormality.

## 2023-06-05 ENCOUNTER — Other Ambulatory Visit: Payer: Self-pay

## 2023-06-05 MED ORDER — ROSUVASTATIN CALCIUM 10 MG PO TABS: 10.0000 mg | ORAL_TABLET | ORAL | 0 refills | Status: DC

## 2023-07-25 ENCOUNTER — Other Ambulatory Visit: Payer: Self-pay

## 2023-07-25 MED ORDER — ROSUVASTATIN CALCIUM 10 MG PO TABS: 10.0000 mg | ORAL_TABLET | ORAL | 2 refills | Status: DC

## 2023-08-22 NOTE — Patient Instructions (Addendum)
Sign release of information at the check out desk for diabetic eye exam.  We are requesting pap smear- we do not have one on file for several years and have made several requests in past- at next visit please see if they can give you physical copy and drop off to [place] or bring to next visit  Please call [facility] to schedule your mammogram- we have order on file if they need it  Call Dr Sharl Ma for follow up and have him send [place] note  Please stop by lab before you go If you have mychart- we will send your results within 3 business days of [place] receiving them.  If you do not have mychart- we will call you about results within 5 business days of [place] receiving them.  *please also note that you will see labs on mychart as soon as they post. I will later go in and write notes on them- will say "notes from Dr. Durene Cal"   Recommended follow up: Return in about 1 year (around 08/22/2024) for physical or sooner if needed.Schedule b4 you leave.

## 2023-08-23 ENCOUNTER — Ambulatory Visit (INDEPENDENT_AMBULATORY_CARE_PROVIDER_SITE_OTHER): Payer: No Typology Code available for payment source | Admitting: Family Medicine

## 2023-08-23 ENCOUNTER — Encounter: Payer: Self-pay | Admitting: Family Medicine

## 2023-08-23 VITALS — BP 110/70 | HR 78 | Temp 97.7°F | Ht 66.0 in | Wt 168.2 lb

## 2023-08-23 DIAGNOSIS — E109 Type 1 diabetes mellitus without complications: Secondary | ICD-10-CM | POA: Diagnosis not present

## 2023-08-23 DIAGNOSIS — E559 Vitamin D deficiency, unspecified: Secondary | ICD-10-CM

## 2023-08-23 DIAGNOSIS — E785 Hyperlipidemia, unspecified: Secondary | ICD-10-CM

## 2023-08-23 DIAGNOSIS — R7989 Other specified abnormal findings of blood chemistry: Secondary | ICD-10-CM | POA: Diagnosis not present

## 2023-08-23 DIAGNOSIS — Z0000 Encounter for general adult medical examination without abnormal findings: Secondary | ICD-10-CM

## 2023-08-23 DIAGNOSIS — Z1231 Encounter for screening mammogram for malignant neoplasm of breast: Secondary | ICD-10-CM

## 2023-08-23 DIAGNOSIS — E1169 Type 2 diabetes mellitus with other specified complication: Secondary | ICD-10-CM

## 2023-08-23 DIAGNOSIS — G2581 Restless legs syndrome: Secondary | ICD-10-CM | POA: Diagnosis not present

## 2023-08-23 LAB — MICROALBUMIN / CREATININE URINE RATIO
Creatinine,U: 46.9 mg/dL
Microalb Creat Ratio: 1.5 mg/g (ref 0.0–30.0)
Microalb, Ur: <0.7 mg/dL (ref 0.0–1.9)

## 2023-08-23 LAB — COMPREHENSIVE METABOLIC PANEL
ALT: 9 U/L (ref 0–35)
AST: 17 U/L (ref 0–37)
Albumin: 4.1 g/dL (ref 3.5–5.2)
Alkaline Phosphatase: 71 U/L (ref 39–117)
BUN: 14 mg/dL (ref 6–23)
CO2: 30 meq/L (ref 19–32)
Calcium: 9.3 mg/dL (ref 8.4–10.5)
Chloride: 103 meq/L (ref 96–112)
Creatinine, Ser: 0.73 mg/dL (ref 0.40–1.20)
GFR: 102.85 mL/min (ref >60.00)
Glucose, Bld: 181 mg/dL — ABNORMAL HIGH (ref 70–99)
Potassium: 3.9 meq/L (ref 3.5–5.1)
Sodium: 139 meq/L (ref 135–145)
Total Bilirubin: 0.4 mg/dL (ref 0.2–1.2)
Total Protein: 7.5 g/dL (ref 6.0–8.3)

## 2023-08-23 LAB — LIPID PANEL
Cholesterol: 152 mg/dL (ref 0–200)
HDL: 59 mg/dL (ref >39.00)
LDL Cholesterol: 85 mg/dL (ref 0–99)
NonHDL: 92.95
Total CHOL/HDL Ratio: 3
Triglycerides: 41 mg/dL (ref 0.0–149.0)
VLDL: 8.2 mg/dL (ref 0.0–40.0)

## 2023-08-23 LAB — IBC + FERRITIN
Ferritin: 16.3 ng/mL (ref 10.0–291.0)
Iron: 53 ug/dL (ref 42–145)
Saturation Ratios: 15.6 % — ABNORMAL LOW (ref 20.0–50.0)
TIBC: 338.8 ug/dL (ref 250.0–450.0)
Transferrin: 242 mg/dL (ref 212.0–360.0)

## 2023-08-23 LAB — CBC WITH DIFFERENTIAL/PLATELET
Basophils Absolute: 0 10*3/uL (ref 0.0–0.1)
Basophils Relative: 0.7 % (ref 0.0–3.0)
Eosinophils Absolute: 0.1 10*3/uL (ref 0.0–0.7)
Eosinophils Relative: 0.8 % (ref 0.0–5.0)
HCT: 44 % (ref 36.0–46.0)
Hemoglobin: 14.3 g/dL (ref 12.0–15.0)
Lymphocytes Relative: 39 % (ref 12.0–46.0)
Lymphs Abs: 2.4 10*3/uL (ref 0.7–4.0)
MCHC: 32.5 g/dL (ref 30.0–36.0)
MCV: 87.6 fL (ref 78.0–100.0)
Monocytes Absolute: 0.4 10*3/uL (ref 0.1–1.0)
Monocytes Relative: 7.1 % (ref 3.0–12.0)
Neutro Abs: 3.2 10*3/uL (ref 1.4–7.7)
Neutrophils Relative %: 52.4 % (ref 43.0–77.0)
Platelets: 270 10*3/uL (ref 150.0–400.0)
RBC: 5.02 Mil/uL (ref 3.87–5.11)
RDW: 13.2 % (ref 11.5–15.5)
WBC: 6 10*3/uL (ref 4.0–10.5)

## 2023-08-23 LAB — VITAMIN D 25 HYDROXY (VIT D DEFICIENCY, FRACTURES): VITD: 38.8 ng/mL (ref 30.00–100.00)

## 2023-08-23 LAB — VITAMIN B12: Vitamin B-12: 700 pg/mL (ref 211–911)

## 2023-08-23 LAB — HEMOGLOBIN A1C: Hgb A1c MFr Bld: 7.6 % — ABNORMAL HIGH (ref 4.6–6.5)

## 2023-08-23 LAB — TSH: TSH: 1.03 u[IU]/mL (ref 0.35–5.50)

## 2023-08-23 NOTE — Progress Notes (Signed)
Phone [phone]   Subjective:  Patient presents today for their annual physical. Chief complaint-noted.   See problem oriented charting- ROS- full  review of systems was completed and negative except for: trigger finger, left ear stopped up this morning - not using Qtips  The following were reviewed and entered/updated in epic: Past Medical History:  Diagnosis Date   Allergy 2017   Diabetes mellitus without complication ([facility])    Dr. Sharl Ma with [facility]. On insulin pump. Dexcom sensor that talks to the pump   PCOS (polycystic ovarian syndrome)    metformin and actos in the past- didnt help that much. shaves daily.    Seasonal allergies    zyrtec   Patient Active Problem List   Diagnosis Date Noted   Type 1 diabetes mellitus (HCC) 01/20/2016    Priority: High   Hyperlipidemia associated with type 2 diabetes mellitus ([facility]) 03/09/2021    Priority: Medium    PCOS (polycystic ovarian syndrome)     Priority: Medium    Low vitamin B12 level 01/20/2016    Priority: Medium    Vitamin D deficiency 08/18/2016    Priority: Low   Seasonal allergies     Priority: Low   Long term current use of insulin ([facility]) 10/25/2015    Priority: Low   Atypical chest pain 08/18/2016   Past Surgical History:  Procedure Laterality Date   OVARIAN CYST REMOVAL  2013    Family History  Problem Relation Age of Onset   Heart disease Paternal Grandfather 47       heart disease related   Prostate cancer Paternal Grandfather    Healthy Mother    Hyperlipidemia Father        monitoring at present   COPD Maternal Grandmother        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   Arthritis Maternal Grandmother    Cancer Maternal Grandfather        prostate cancer & died at age 48   Ovarian cancer Paternal Grandmother    Lung cancer Maternal Uncle        smoker   CAD Maternal Uncle        massive MI under age 77   Colon cancer Neg Hx    Pancreatic cancer Neg Hx    Stomach cancer Neg Hx     Esophageal cancer Neg Hx     Medications- reviewed and updated Current Outpatient Medications  Medication Sig Dispense Refill   cetirizine (ZYRTEC) 10 MG tablet Take 1 tablet by mouth once daily 90 tablet 0   Cholecalciferol (VITAMIN D) 2000 units CAPS Take 2 capsules by mouth daily.     Glucagon (GVOKE HYPOPEN 2-PACK) 0.5 MG/0.1ML SOAJ Inject 0.5 mg into the skin daily as needed (for low blood sugar). 0.2 mL 1   Insulin Human (INSULIN PUMP) SOLN Inject into the skin as directed. Humalog insulin pump     insulin lispro (HUMALOG) 100 UNIT/ML injection      rosuvastatin (CRESTOR) 10 MG tablet Take 1 tablet (10 mg total) by mouth once a week. 13 tablet 2   vitamin B-12 (CYANOCOBALAMIN) 50 MCG tablet Take 500 mcg by mouth 3 (three) times a week.     NOVOLOG 100 UNIT/ML injection SMARTSIG:0-67 Unit(s) SUB-Q Daily     No current facility-administered medications for this visit.    Allergies-reviewed and updated No Known Allergies  Social History   Social History Narrative   Lives with wife in 2 story home.  Has 1  stepdaughter- not in the home.       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    Education: associates degree. [employer].       Hobbies: time with dogs ( 2 small Chihuahuas) older   Objective  Objective:  BP 110/70   Pulse 78   Temp 97.7 F (36.5 C)   Ht 5\' 6"  (1.676 m)   Wt 168 lb 3.2 oz (76.3 kg)   SpO2 97%   BMI 27.15 kg/m  Gen: NAD, resting comfortably HEENT: Mucous membranes are moist. Oropharynx normal Neck: no thyromegaly CV: RRR no murmurs rubs or gallops Lungs: CTAB no crackles, wheeze, rhonchi Abdomen: soft/nontender/nondistended/normal bowel sounds. No rebound or guarding.  Ext: no edema Skin: warm, dry Neuro: grossly normal, moves all extremities, PERRLA  Diabetic Foot Exam - Simple   Simple Foot Form Diabetic Foot exam was performed with the following  findings: Yes 08/23/2023  8:47 AM  Visual Inspection No deformities, no ulcerations, no other skin breakdown bilaterally: Yes Sensation Testing Intact to touch and monofilament testing bilaterally: Yes Pulse Check Posterior Tibialis and Dorsalis pulse intact bilaterally: Yes Comments      Assessment and Plan   42 y.o. female presenting for annual physical.  Health Maintenance counseling: 1. Anticipatory guidance: Patient counseled regarding regular dental exams -q6 months, eye exams - yearly,  avoiding smoking and second hand smoke , limiting alcohol to 1 beverage per day- does not drink , no illicit drugs .   2. Risk factor reduction:  Advised patient of need for regular exercise and diet rich and fruits and vegetables to reduce risk of heart attack and stroke.  Exercise- down lately- trying to restart 3-4 days a week at work with temperatures better- summer and winter tougher for her- may try gym again.  Diet/weight management-down 4 lbs in last year- no major changes other than trying to pick healthier and smaller meals.  Wt Readings from Last 3 Encounters:  08/23/23 168 lb 3.2 oz (76.3 kg)  08/21/22 172 lb (78 kg)  03/09/21 172 lb (78 kg)  3. Immunizations/screenings/ancillary studies- opts out of COVID 19 and Prevnar 20 for now.  Immunization History  Administered Date(s) Administered   Influenza,inj,Quad PF,6+ Mos 07/17/2019, 08/21/2021, 07/31/2023   Influenza-Unspecified 08/07/2016, 07/26/2017, 07/26/2018, 07/27/2020, 08/21/2021, 07/29/2022   Moderna Sars-Covid-2 Vaccination 11/27/2019, 12/26/2019, 09/10/2020   Pneumococcal Polysaccharide-23 11/09/2013   Tdap 08/17/2016  4. Cervical cancer screening- last we have on file is June 18, 2016- patient reports having 1 since that time-we will update records-request release of information- again this year 5. Breast cancer screening-  breast exam with gynecology.  She is planning on yearly mammograms starting at age 54  - at present  ordered- plans at [facility] 6. Colon cancer screening -  no family history, start at age 70  - father had polypsand she is going to check on age if precancerous at 69 then would consider at present 7. Skin cancer screening-sees dermatology every 2-3 years at least . advised regular sunscreen use. Denies worrisome, changing, or new skin lesions.  8. Birth control/STD check-monogamous with female spouse   41. Osteoporosis screening at 54- we will plan on this sometime when she is postmenopausal. No family history osteoporosis -Never smoker   Status of chronic or acute concerns   #R shoulder/arm pain with paresthesia last year- doing better. But does have right hand trigger finger- discussed double bandaid splint  #  Diabetes type I-follows with Dr. Sharl Ma. 780g medtronic pump S: Medication: humalog through insulin pump. Last visit in April - was 7.8 on a1c at that time.  CBGs- no lows - has had some highs  A/P: check a1c and she plans to schedule follow up Dr. Sharl Ma  #Restless legs sensation- has noted restlessness and some discomfort with feeling like needs to move legs- does get relief when moves legs. Cycles not heavy but worse around cycles.   #hyperlipidemia on 03/22/2021 CT calcium scoring of 0 S: Medication: Rosuvastatin 10 mg once a week -dad had significant calcium score of 840 Lab Results  Component Value Date   CHOL 156 08/23/2022   HDL 63.30 08/23/2022   LDLCALC 84 08/23/2022   LDLDIRECT 109.0 08/17/2016   TRIG 46.0 08/23/2022   CHOLHDL 2 08/23/2022  A/P: update lipids- she's willing to consider twice a week 20 mg to push for LDL under 70  #Vitamin D deficiency S: Medication: Vit D 4000 units daily. Up from 2000 last year Last vitamin D Lab Results  Component Value Date   VD25OH 28.48 (L) 08/23/2022  A/P:  hopefully improved- update vitamin D today. Continue current meds for now   # B12 deficiency S: Current treatment/medication (oral vs. IM): vitamin b12 3x a week Lab  Results  Component Value Date   VITAMINB12 565 08/23/2022  A/P: hopefully stable- update vitamin b12 today. Continue current meds for now    #PCOS- untreated, patient still ovulate/gets regular cycles  #fatigue- check TSH- sleeps well and no snoring  Recommended follow up: Return in about 1 year (around 08/22/2024) for physical or sooner if needed.Schedule b4 you leave.  Lab/Order associations: fasting   ICD-10-CM   1. Preventative health care  Z00.00     2. Type 1 diabetes mellitus without complication (HCC)  E10.9 Microalbumin / creatinine urine ratio    Hemoglobin A1c    3. Vitamin D deficiency  E55.9 VITAMIN D 25 Hydroxy (Vit-D Deficiency, Fractures)    4. Hyperlipidemia associated with type 2 diabetes mellitus (HCC)  E11.69 Comprehensive metabolic panel   O96.2 CBC with Differential/Platelet    Lipid panel    TSH    5. Low vitamin B12 level  R79.89 Vitamin B12    6. Restless legs syndrome (RLS)  G25.81 IBC + Ferritin    7. Encounter for screening mammogram for malignant neoplasm of breast  Z12.31 MM Digital Screening      No orders of the defined types were placed in this encounter.   Return precautions advised.  Tana Conch, MD

## 2023-08-26 ENCOUNTER — Other Ambulatory Visit: Payer: Self-pay

## 2023-08-26 MED ORDER — ROSUVASTATIN CALCIUM 10 MG PO TABS: 10.0000 mg | ORAL_TABLET | ORAL | 3 refills | Status: DC

## 2023-08-26 NOTE — Telephone Encounter (Signed)
See below

## 2023-09-16 LAB — HM MAMMOGRAPHY

## 2023-09-18 ENCOUNTER — Encounter: Payer: Self-pay | Admitting: Family Medicine

## 2024-03-11 ENCOUNTER — Other Ambulatory Visit: Payer: Self-pay

## 2024-03-11 MED ORDER — ROSUVASTATIN CALCIUM 10 MG PO TABS: 10.0000 mg | ORAL_TABLET | ORAL | 3 refills | Status: AC

## 2024-08-24 ENCOUNTER — Encounter: Payer: No Typology Code available for payment source | Admitting: Family Medicine

## 2024-08-28 ENCOUNTER — Encounter: Payer: Self-pay | Admitting: Family Medicine

## 2024-08-28 ENCOUNTER — Ambulatory Visit (INDEPENDENT_AMBULATORY_CARE_PROVIDER_SITE_OTHER): Payer: Self-pay | Admitting: Family Medicine

## 2024-08-28 ENCOUNTER — Ambulatory Visit: Payer: Self-pay | Admitting: Family Medicine

## 2024-08-28 VITALS — BP 110/68 | HR 80 | Temp 98.0°F | Ht 66.0 in | Wt 165.2 lb

## 2024-08-28 DIAGNOSIS — G2581 Restless legs syndrome: Secondary | ICD-10-CM

## 2024-08-28 DIAGNOSIS — Z794 Long term (current) use of insulin: Secondary | ICD-10-CM

## 2024-08-28 DIAGNOSIS — E1169 Type 2 diabetes mellitus with other specified complication: Secondary | ICD-10-CM | POA: Diagnosis not present

## 2024-08-28 DIAGNOSIS — R7989 Other specified abnormal findings of blood chemistry: Secondary | ICD-10-CM

## 2024-08-28 DIAGNOSIS — Z0000 Encounter for general adult medical examination without abnormal findings: Secondary | ICD-10-CM | POA: Diagnosis not present

## 2024-08-28 DIAGNOSIS — E559 Vitamin D deficiency, unspecified: Secondary | ICD-10-CM

## 2024-08-28 DIAGNOSIS — E785 Hyperlipidemia, unspecified: Secondary | ICD-10-CM

## 2024-08-28 LAB — COMPREHENSIVE METABOLIC PANEL WITH GFR
ALT: 10 U/L (ref 0–35)
AST: 17 U/L (ref 0–37)
Albumin: 4.4 g/dL (ref 3.5–5.2)
Alkaline Phosphatase: 72 U/L (ref 39–117)
BUN: 17 mg/dL (ref 6–23)
CO2: 28 meq/L (ref 19–32)
Calcium: 9.3 mg/dL (ref 8.4–10.5)
Chloride: 105 meq/L (ref 96–112)
Creatinine, Ser: 0.7 mg/dL (ref 0.40–1.20)
GFR: 107.39 mL/min (ref >60.00)
Glucose, Bld: 112 mg/dL — ABNORMAL HIGH (ref 70–99)
Potassium: 4.5 meq/L (ref 3.5–5.1)
Sodium: 142 meq/L (ref 135–145)
Total Bilirubin: 0.5 mg/dL (ref 0.2–1.2)
Total Protein: 7.4 g/dL (ref 6.0–8.3)

## 2024-08-28 LAB — CBC WITH DIFFERENTIAL/PLATELET
Basophils Absolute: 0 K/uL (ref 0.0–0.1)
Basophils Relative: 0.4 % (ref 0.0–3.0)
Eosinophils Absolute: 0.1 K/uL (ref 0.0–0.7)
Eosinophils Relative: 0.9 % (ref 0.0–5.0)
HCT: 41.3 % (ref 36.0–46.0)
Hemoglobin: 13.7 g/dL (ref 12.0–15.0)
Lymphocytes Relative: 46.5 % — ABNORMAL HIGH (ref 12.0–46.0)
Lymphs Abs: 2.7 K/uL (ref 0.7–4.0)
MCHC: 33.3 g/dL (ref 30.0–36.0)
MCV: 86 fl (ref 78.0–100.0)
Monocytes Absolute: 0.4 K/uL (ref 0.1–1.0)
Monocytes Relative: 7.1 % (ref 3.0–12.0)
Neutro Abs: 2.6 K/uL (ref 1.4–7.7)
Neutrophils Relative %: 45.1 % (ref 43.0–77.0)
Platelets: 260 K/uL (ref 150.0–400.0)
RBC: 4.8 Mil/uL (ref 3.87–5.11)
RDW: 13.3 % (ref 11.5–15.5)
WBC: 5.8 K/uL (ref 4.0–10.5)

## 2024-08-28 LAB — IBC + FERRITIN
Ferritin: 14.2 ng/mL (ref 10.0–291.0)
Iron: 66 ug/dL (ref 42–145)
Saturation Ratios: 18.3 % — ABNORMAL LOW (ref 20.0–50.0)
TIBC: 361.2 ug/dL (ref 250.0–450.0)
Transferrin: 258 mg/dL (ref 212.0–360.0)

## 2024-08-28 LAB — LIPID PANEL
Cholesterol: 167 mg/dL (ref 0–200)
HDL: 62.1 mg/dL (ref >39.00)
LDL Cholesterol: 95 mg/dL (ref 0–99)
NonHDL: 104.44
Total CHOL/HDL Ratio: 3
Triglycerides: 49 mg/dL (ref 0.0–149.0)
VLDL: 9.8 mg/dL (ref 0.0–40.0)

## 2024-08-28 LAB — VITAMIN B12: Vitamin B-12: 719 pg/mL (ref 211–911)

## 2024-08-28 LAB — HEMOGLOBIN A1C: Hgb A1c MFr Bld: 7.2 % — ABNORMAL HIGH (ref 4.6–6.5)

## 2024-08-28 LAB — MICROALBUMIN / CREATININE URINE RATIO
Creatinine,U: 23.7 mg/dL
Microalb Creat Ratio: UNDETERMINED mg/g (ref 0.0–30.0)
Microalb, Ur: <0.7 mg/dL

## 2024-08-28 LAB — VITAMIN D 25 HYDROXY (VIT D DEFICIENCY, FRACTURES): VITD: 44.26 ng/mL (ref 30.00–100.00)

## 2024-08-28 LAB — TSH: TSH: 1.27 u[IU]/mL (ref 0.35–5.50)

## 2024-08-28 NOTE — Patient Instructions (Addendum)
 Please stop by lab before you go If you have mychart- we will send your results within 3 business days of us  receiving them.  If you do not have mychart- we will call you about results within 5 business days of us  receiving them.  *please also note that you will see labs on mychart as soon as they post. I will later go in and write notes on them- will say notes from Dr. Katrinka   No changes today unless labs lead us  to make changes  Recommended follow up: Return in about 1 year (around 08/28/2025) for physical or sooner if needed.Schedule b4 you leave.

## 2024-08-28 NOTE — Progress Notes (Signed)
 Phone [phone]   Subjective:  Patient presents today for their annual physical. Chief complaint-noted.   See problem oriented charting- ROS- full  review of systems was completed and negative except for topics noted under acute/chronic concerns  The following were reviewed and entered/updated in epic: Past Medical History:  Diagnosis Date   Allergy 2017   Diabetes mellitus without complication (HCC)    Dr. Faythe with [facility]. On insulin  pump. Dexcom sensor that talks to the pump   PCOS (polycystic ovarian syndrome)    metformin and actos in the past- didnt help that much. shaves daily.    Seasonal allergies    zyrtec    Patient Active Problem List   Diagnosis Date Noted   Type 1 diabetes mellitus (HCC) 01/20/2016    Priority: High   Hyperlipidemia associated with type 2 diabetes mellitus (HCC) 03/09/2021    Priority: Medium    PCOS (polycystic ovarian syndrome)     Priority: Medium    Low vitamin B12 level 01/20/2016    Priority: Medium    Vitamin D  deficiency 08/18/2016    Priority: Low   Seasonal allergies     Priority: Low   Long term current use of insulin  (HCC) 10/25/2015    Priority: Low   Atypical chest pain 08/18/2016   Past Surgical History:  Procedure Laterality Date   OVARIAN CYST REMOVAL  2013    Family History  Problem Relation Age of Onset   Heart disease Paternal Grandfather 53       heart disease related   Prostate cancer Paternal Grandfather    Healthy Mother    Hyperlipidemia Father        monitoring at present   COPD Maternal Grandmother        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   Arthritis Maternal Grandmother    Cancer Maternal Grandfather        prostate cancer & died at age 15   Ovarian cancer Paternal Grandmother    Lung cancer Maternal Uncle        smoker   CAD Maternal Uncle        massive MI under age 89   Colon cancer Neg Hx    Pancreatic cancer Neg Hx    Stomach cancer Neg Hx    Esophageal cancer Neg Hx      Medications- reviewed and updated Current Outpatient Medications  Medication Sig Dispense Refill   cetirizine  (ZYRTEC ) 10 MG tablet Take 1 tablet by mouth once daily 90 tablet 0   Cholecalciferol  (VITAMIN D ) 2000 units CAPS Take 2 capsules by mouth daily.     Glucagon  (GVOKE HYPOPEN  2-PACK) 0.5 MG/0.1ML SOAJ Inject 0.5 mg into the skin daily as needed (for low blood sugar). 0.2 mL 1   Insulin  Human (INSULIN  PUMP) SOLN Inject into the skin as directed. Humalog insulin  pump     insulin  lispro (HUMALOG) 100 UNIT/ML injection      rosuvastatin  (CRESTOR ) 10 MG tablet Take 1 tablet (10 mg total) by mouth 2 (two) times a week. (Patient taking differently: Take 10 mg by mouth once a week.) 26 tablet 3   vitamin B-12 (CYANOCOBALAMIN ) 50 MCG tablet Take 500 mcg by mouth 3 (three) times a week.     No current facility-administered medications for this visit.    Allergies-reviewed and updated No Known Allergies  Social History   Social History Narrative   Lives with wife in 2 story home.  Has 1 stepdaughter- not in the home.  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    Education: associates degree. [employer].       Hobbies: time with dogs ( 2 small Chihuahuas) older   Objective  Objective:  BP 110/68 (BP Location: Left Arm, Patient Position: Sitting, Cuff Size: Normal)   Pulse 80   Temp 98 F (36.7 C) (Temporal)   Ht 5' 6 (1.676 m)   Wt 165 lb 3.2 oz (74.9 kg)   LMP 08/19/2024   SpO2 98%   BMI 26.66 kg/m  Gen: NAD, resting comfortably HEENT: Mucous membranes are moist. Oropharynx normal Neck: no thyromegaly CV: RRR no murmurs rubs or gallops Lungs: CTAB no crackles, wheeze, rhonchi Abdomen: soft/nontender/nondistended/normal bowel sounds. No rebound or guarding.  Ext: no edema Skin: warm, dry Neuro: grossly normal, moves all extremities, PERRLA Diabetic Foot Exam - Simple   Simple  Foot Form Diabetic Foot exam was performed with the following findings: Yes 08/28/2024  8:09 AM  Visual Inspection No deformities, no ulcerations, no other skin breakdown bilaterally: Yes Sensation Testing Intact to touch and monofilament testing bilaterally: Yes Pulse Check Posterior Tibialis and Dorsalis pulse intact bilaterally: Yes Comments       Assessment and Plan   43 y.o. female presenting for annual physical.  Health Maintenance counseling: 1. Anticipatory guidance: Patient counseled regarding regular dental exams -q6 months, eye exams - yearly,  avoiding smoking and second hand smoke , limiting alcohol to 1 beverage per day- does not drink , no illicit drugs .   2. Risk factor reduction:  Advised patient of need for regular exercise and diet rich and fruits and vegetables to reduce risk of heart attack and stroke.  Exercise- trying to walk at lunch but sugar issues getting in way- plans to work with endo on this to adjust pump.  Diet/weight management-down 3 lbs- being intentional about what she eats- decreased portions.  Wt Readings from Last 3 Encounters:  08/28/24 165 lb 3.2 oz (74.9 kg)  08/23/23 168 lb 3.2 oz (76.3 kg)  08/21/22 172 lb (78 kg)  3. Immunizations/screenings/ancillary studies- Prevnar 20 option in future- opts out for now  Immunization History  Administered Date(s) Administered   Fluzone Influenza virus vaccine,trivalent (IIV3), split virus 09/04/2011, 07/30/2012, 07/11/2013, 07/14/2013, 07/27/2014, 08/06/2016, 07/27/2020   Influenza,inj,Quad PF,6+ Mos 07/17/2019, 08/21/2021, 07/31/2023   Influenza,inj,quad, With Preservative 08/22/2015   Influenza,trivalent, recombinat, inj, PF 07/28/2024   Influenza-Unspecified 08/07/2016, 07/26/2017, 07/26/2018, 08/21/2021, 07/29/2022   Moderna Sars-Covid-2 Vaccination 11/27/2019, 12/26/2019, 09/10/2020   Pneumococcal Polysaccharide-23 11/09/2013   Tdap 08/17/2016  4. Cervical cancer screening- 11/27/2022 with 3-5-year  repeat 5. Breast cancer screening-  breast exam with GYN and mammogram -09/16/2023- goes next month 6. Colon cancer screening - no family history, start at age 62 . Father had polyps but not before 30 - not candidate for cologuard 7. Skin cancer screening-sees every 2 to 3 years dermatology. advised regular sunscreen use. Denies worrisome, changing, or new skin lesions.  8. Birth control/STD check-monogamous with female spouse 89. Osteoporosis screening at 67-we will plan on this postmenopausal.  No family history of osteoporosis 10. Smoking associated screening -never smoker  Status of chronic or acute concerns   # Diabetes type I-follows with Dr. Faythe- last seen in April  S: Medication: humalog through insulin  pump. Sparing lows- afternoon has been hard to get in check- plans to schedule with Dr. Faythe after our visit Lab Results  Component Value Date  HGBA1C 7.6 (H) 08/23/2023   HGBA1C 7.5 (H) 08/23/2022   HGBA1C 7.6 (H) 03/09/2021  A/P: hopefully stable or improved- update a1c today. Continue current meds for now . Work with Dr. Faythe on adjustments to avoid lows  #hyperlipidemia on 03/22/2021 CT calcium  scoring of 0 S: Medication:Rosuvastatin  10 mg once a week--> 20 mg twice a week but sent in as 10 mg- she's only taking once a week though -lipoprotein (a) check today Lab Results  Component Value Date   CHOL 152 08/23/2023   HDL 59.00 08/23/2023   LDLCALC 85 08/23/2023   LDLDIRECT 109.0 08/17/2016   TRIG 41.0 08/23/2023   CHOLHDL 3 08/23/2023  A/P: pretty close to ideal last year even on once a week- we had planned on increasing but still on once a day right now- update today and reevaluate   #Vitamin D  deficiency S: Medication: Vit D 4000 units daily. Last vitamin D  Lab Results  Component Value Date   VD25OH 38.80 08/23/2023  A/P: hopefully stable- update vitamin d  today. Continue current meds for now    # B12 deficiency S: Current treatment/medication (oral vs. IM):  vitamin b12 50mcg 3x a week A/P: hopefully stable- update b12 today. Continue current meds for now    #PCOS- untreated, patient still ovulate/gets regular cycles  Recommended follow up: Return in about 1 year (around 08/28/2025) for physical or sooner if needed.Schedule b4 you leave.  Lab/Order associations: fasting   ICD-10-CM   1. Preventative health care  Z00.00     2. Hyperlipidemia associated with type 2 diabetes mellitus (HCC)  E11.69 Comprehensive metabolic panel with GFR   E78.5 CBC with Differential/Platelet    Lipid panel    Hemoglobin A1c    Microalbumin / creatinine urine ratio    Lipoprotein A (LPA)    TSH    3. Low vitamin B12 level  R79.89 Vitamin B12    4. Vitamin D  deficiency  E55.9 VITAMIN D  25 Hydroxy (Vit-D Deficiency, Fractures)    5. Restless legs syndrome (RLS)  G25.81 IBC + Ferritin      No orders of the defined types were placed in this encounter.   Return precautions advised.  Garnette Lukes, MD

## 2024-09-02 LAB — LIPOPROTEIN A (LPA): Lipoprotein (a): 63 nmol/L (ref <75)

## 2024-09-08 LAB — OPHTHALMOLOGY REPORT-SCANNED

## 2024-09-22 LAB — HM MAMMOGRAPHY

## 2024-09-23 ENCOUNTER — Encounter: Payer: Self-pay | Admitting: Family Medicine

## 2025-09-03 ENCOUNTER — Encounter: Admitting: Family Medicine
